# Patient Record
Sex: Male | Born: 1960 | Race: White | Hispanic: No | Marital: Single | Smoking: Former smoker
Health system: Southern US, Community
[De-identification: ages and names within clinical notes are randomized; demographics above are authoritative.]

## PROBLEM LIST (undated history)

## (undated) DIAGNOSIS — F1410 Cocaine abuse, uncomplicated: Secondary | ICD-10-CM

## (undated) DIAGNOSIS — L409 Psoriasis, unspecified: Secondary | ICD-10-CM

## (undated) DIAGNOSIS — I10 Essential (primary) hypertension: Secondary | ICD-10-CM

## (undated) DIAGNOSIS — E785 Hyperlipidemia, unspecified: Secondary | ICD-10-CM

## (undated) DIAGNOSIS — I2510 Atherosclerotic heart disease of native coronary artery without angina pectoris: Secondary | ICD-10-CM

## (undated) HISTORY — DX: Psoriasis, unspecified: L40.9

## (undated) HISTORY — DX: Hyperlipidemia, unspecified: E78.5

## (undated) HISTORY — DX: Cocaine abuse, uncomplicated: F14.10

## (undated) HISTORY — PX: OTHER SURGICAL HISTORY: SHX169

## (undated) HISTORY — DX: Atherosclerotic heart disease of native coronary artery without angina pectoris: I25.10

## (undated) HISTORY — DX: Essential (primary) hypertension: I10

---

## 2008-07-17 ENCOUNTER — Ambulatory Visit: Payer: Self-pay | Admitting: Cardiology

## 2008-07-17 ENCOUNTER — Encounter: Payer: Self-pay | Admitting: Cardiology

## 2008-07-18 ENCOUNTER — Encounter: Payer: Self-pay | Admitting: Cardiology

## 2008-07-18 ENCOUNTER — Inpatient Hospital Stay (HOSPITAL_COMMUNITY): Admission: AD | Admit: 2008-07-18 | Discharge: 2008-07-21 | Payer: Self-pay | Admitting: Cardiovascular Disease

## 2008-07-18 ENCOUNTER — Ambulatory Visit: Payer: Self-pay | Admitting: Cardiology

## 2008-07-19 ENCOUNTER — Encounter: Payer: Self-pay | Admitting: Cardiology

## 2008-07-20 ENCOUNTER — Encounter: Payer: Self-pay | Admitting: Cardiology

## 2008-07-21 ENCOUNTER — Encounter: Payer: Self-pay | Admitting: Cardiology

## 2008-08-04 ENCOUNTER — Ambulatory Visit: Payer: Self-pay | Admitting: Cardiology

## 2008-09-02 ENCOUNTER — Encounter: Payer: Self-pay | Admitting: Physician Assistant

## 2008-10-17 DIAGNOSIS — I10 Essential (primary) hypertension: Secondary | ICD-10-CM | POA: Insufficient documentation

## 2008-10-17 DIAGNOSIS — E785 Hyperlipidemia, unspecified: Secondary | ICD-10-CM | POA: Insufficient documentation

## 2008-10-17 DIAGNOSIS — I2510 Atherosclerotic heart disease of native coronary artery without angina pectoris: Secondary | ICD-10-CM | POA: Insufficient documentation

## 2008-11-06 ENCOUNTER — Telehealth: Payer: Self-pay | Admitting: Physician Assistant

## 2008-11-06 ENCOUNTER — Encounter: Payer: Self-pay | Admitting: Cardiology

## 2009-01-01 ENCOUNTER — Emergency Department (HOSPITAL_COMMUNITY): Admission: EM | Admit: 2009-01-01 | Discharge: 2009-01-01 | Payer: Self-pay | Admitting: Emergency Medicine

## 2009-01-01 ENCOUNTER — Encounter: Payer: Self-pay | Admitting: Cardiology

## 2009-02-23 ENCOUNTER — Ambulatory Visit: Payer: Self-pay | Admitting: Cardiology

## 2009-02-23 DIAGNOSIS — F1910 Other psychoactive substance abuse, uncomplicated: Secondary | ICD-10-CM

## 2009-03-06 ENCOUNTER — Encounter (INDEPENDENT_AMBULATORY_CARE_PROVIDER_SITE_OTHER): Payer: Self-pay | Admitting: *Deleted

## 2009-03-20 ENCOUNTER — Encounter (INDEPENDENT_AMBULATORY_CARE_PROVIDER_SITE_OTHER): Payer: Self-pay | Admitting: *Deleted

## 2009-03-27 ENCOUNTER — Encounter: Payer: Self-pay | Admitting: Cardiology

## 2009-12-15 ENCOUNTER — Ambulatory Visit: Payer: Self-pay | Admitting: Cardiology

## 2010-02-17 ENCOUNTER — Telehealth (INDEPENDENT_AMBULATORY_CARE_PROVIDER_SITE_OTHER): Payer: Self-pay | Admitting: *Deleted

## 2010-02-22 ENCOUNTER — Encounter: Payer: Self-pay | Admitting: Cardiology

## 2010-02-23 NOTE — Letter (Signed)
Summary: Generic Engineer, agricultural at [facility] [address]   [place], [place] [address]   Phone: [phone]  Fax: [phone]        March 06, 2009 MRN: [id number]    DOUG Hargrove [address] [place], [place]  [address]    Dear Mr. Knupp,  We ordered labwork to check you cholesterol and liver function labs at your January 31st office visit. It does not appear this labwork has been done yet.   Please take the enclosed order to the [facility] at your earliest convenience to have this labwork done. Do not eat or drink after midnight.        Sincerely,  Cyril Loosen, RN, BSN  This letter has been electronically signed by your physician.  Appended Document: Generic Letter Pt's sister called stating pt has not had labs done yet b/c she got the flu and couldn't take him. She states now he has the flu. She states she will have labs done as soon as he's feeling better.

## 2010-02-23 NOTE — Letter (Signed)
Summary: Certified Letter Response Card  Certified Letter Response Card   Imported By: Cyril Loosen, RN, BSN 03/30/2009 10:10:30  _____________________________________________________________________  External Attachment:    Type:   Image     Comment:   External Document

## 2010-02-23 NOTE — Assessment & Plan Note (Signed)
Summary: 6 MO FU PER AUG REMINDER-SRS   Visit Type:  Follow-up Primary Britiany Silbernagel:  Dr.Golding   History of Present Illness: patient has history of cocaine use as well as mild coronary artery disease, and a small diagonal that was subtotally occluded. 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 The patient had a lipid panel done as well as LFTs are largest year. LDL was 99 HDL 31 cholesterol 159/143 liver function tests were within normal limits. Eating too much,  gaining weight. No cocaine. No cigarretes.   No scp only around smokers.  Severe psoriasis, bleeding all the time  Preventive Screening-Counseling & Management  Alcohol-Tobacco     Smoking Status: quit     Year Quit: 06/2008  Current Medications (verified): 1)  Simvastatin 40 Mg Tabs (Simvastatin) .... Take 1 Tab By Mouth At Bedtime 2)  Lisinopril 10 Mg Tabs (Lisinopril) .... Take 1 Tablet By Mouth Once A Day 3)  Allopurinol 100 Mg Tabs (Allopurinol) .... Take 1 Tablet By Mouth Once A Day 4)  Isosorbide Mononitrate Cr 60 Mg Xr24h-Tab (Isosorbide Mononitrate) .... Take 1 Tablet By Mouth Once A Day (Place On File) 5)  Carvedilol 6.25 Mg Tabs (Carvedilol) .... Take 1 Tablet By Mouth Two Times A Day 6)  Aspirin 325 Mg Tabs (Aspirin) .... Take 1 Tablet By Mouth Once A Day 7)  Nitrostat 0.4 Mg Subl (Nitroglycerin) .Marland Kitchen.. 1 Tablet Under Tongue At Onset of Chest Pain; You May Repeat Every 5 Minutes For Up To 3 Doses. 8)  Taclonex 0.005-0.064 % Oint (Calcipotriene-Betameth Diprop) .... Apply To Affected Area Daily, Do Not Apply To Face, Axillae, or Groin  Allergies (verified): No Known Drug Allergies  Past History:  Past Medical History: Last updated: 10/17/2008 HYPERTENSION, UNSPECIFIED (ICD-401.9) HYPERLIPIDEMIA-MIXED (ICD-272.4) CAD, NATIVE VESSEL (ICD-414.01) Psoriasis.  Probable diabetes mellitus  Gout  Family History: Last updated: 10/17/2008 Family History of Cancer:    Social History: Last updated: 10/17/2008 Single  Tobacco Use - Yes.  Alcohol Use - yes Drug Use - yes - cocaine  Risk Factors: Smoking Status: quit (12/15/2009)  Vital Signs:  Patient profile:   50 year old male Height:      68 inches Weight:      225 pounds BMI:     34.33 Pulse rate:   86 / minute BP sitting:   121 / 84  (left arm) Cuff size:   large  Vitals Entered By: Carlye Grippe (December 15, 2009 9:18 AM)  Nutrition Counseling: Patient's BMI is greater than 25 and therefore counseled on weight management options.  Physical Exam  Additional Exam:  General: Well-developed, well-nourished in no distress head: Normocephalic and atraumatic eyes PERRLA/EOMI intact, conjunctiva and lids normal nose: No deformity or lesions mouth normal dentition, normal posterior pharynx neck: Supple, no JVD.  No masses, thyromegaly or abnormal cervical nodes. Normal carotid upstroke and no carotid bruits. lungs: Normal breath sounds bilaterally without wheezing.  Normal percussion heart: regular rate and rhythm with normal S1 and S2, no S3 or S4.  PMI is normal.  No pathological murmurs abdomen: Normal bowel sounds, abdomen is soft and nontender without masses, organomegaly or hernias noted.  No hepatosplenomegaly musculoskeletal: Back normal, normal gait muscle strength and tone normal pulsus: Pulse is normal in all 4 extremities Extremities: No peripheral pitting edema. No definite evidence of gouty arthropathy. neurologic: Alert and oriented x 3 skin: notable for significant lesions on both arms and extensor surfaces of the lower extremities consistent with  psoriasis. Some areas have mild oozing. The lesions appear to be dry. cervical nodes: No significant adenopathy psychologic: Normal affect    Impression & Recommendations:  Problem # 1:  CAD, NATIVE VESSEL (ICD-414.01) the patient has mild coronary artery disease.  He had a small diagonal vessel that was subtotally  occluded.  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  He denies any chest pain. His updated medication list for this problem includes:    Lisinopril 10 Mg Tabs (Lisinopril) .Marland Kitchen... Take 1 tablet by mouth once a day    Isosorbide Mononitrate Cr 60 Mg Xr24h-tab (Isosorbide mononitrate) .Marland Kitchen... Take 1 tablet by mouth once a day (place on file)    Carvedilol 6.25 Mg Tabs (Carvedilol) .Marland Kitchen... Take 1 tablet by mouth two times a day    Aspirin 325 Mg Tabs (Aspirin) .Marland Kitchen... Take 1 tablet by mouth once a day    Nitrostat 0.4 Mg Subl (Nitroglycerin) .Marland Kitchen... 1 tablet under tongue at onset of chest pain; you may repeat every 5 minutes for up to 3 doses.  Problem # 2:  SUBSTANCE ABUSE (ICD-305.90) patient has history of cocaine use.  Apparently has stopped using this  Problem # 3:  HYPERTENSION, UNSPECIFIED (ICD-401.9) stable His updated medication list for this problem includes:    Lisinopril 10 Mg Tabs (Lisinopril) .Marland Kitchen... Take 1 tablet by mouth once a day    Carvedilol 6.25 Mg Tabs (Carvedilol) .Marland Kitchen... Take 1 tablet by mouth two times a day    Aspirin 325 Mg Tabs (Aspirin) .Marland Kitchen... Take 1 tablet by mouth once a day  Problem # 4:  HYPERLIPIDEMIA-MIXED (ICD-272.4) we discussed his lipid panel.  LDL 99 HDL 31. His updated medication list for this problem includes:    Simvastatin 40 Mg Tabs (Simvastatin) .Marland Kitchen... Take 1 tab by mouth at bedtime  Patient Instructions: 1)  Taclonex - apply to affected area daily 2)  Follow up in  6 months Prescriptions: TACLONEX 0.005-0.064 % OINT (CALCIPOTRIENE-BETAMETH DIPROP) apply to affected area daily, do not apply to face, axillae, or groin  #1 x 2   Entered by:   Hoover Brunette, LPN   Authorized by:   Lewayne Bunting, MD, [facility]   Signed by:   Hoover Brunette, LPN on 16/10/9602   Method used:   Electronically to        [facility] (retail)       [address]       [place], [place]  [address]       Ph: [phone]       Fax: [phone]   RxID:    [id number]   Handout requested.

## 2010-02-23 NOTE — Consult Note (Signed)
Summary: CARDIOLOGY CONSULT/MH  CARDIOLOGY CONSULT/MH   Imported By: Zachary George 02/23/2009 11:24:04  _____________________________________________________________________  External Attachment:    Type:   Image     Comment:   External Document

## 2010-02-23 NOTE — Assessment & Plan Note (Signed)
Summary: 6 MO FU REMINDER-SRS   Visit Type:  Follow-up Primary Provider:  Dr.Golding  CC:  follow-up visit.  History of Present Illness: the patient is a 50 year old male with a history of substernal chest pain and coronary artery disease. The patient status post cardiac catheterization July 18, 2008. His cocaine screen is positive at that time. Was found to have a small diagonal was totally occluded. 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 There was also 5060% stenosis in right coronary artery. The patient was treated medically. Patient also reports tobacco use. He denies any current substance abuse. The patient reports that he is compliant with his medical regimen. He has occasional exertional chest pain. He did not receive a stent and therefore Plavix can be discontinued. He denies any rectal pain PND palpitations or syncope. 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 He reports insomnia and uses occasional Xanax.  Clinical Review Panels:  CXR CXR results The cardiopericardial silhouette is enlarged. There is         bibasilar atelectasis without focal airspace consolidation or         pulmonary edema.  No evidence for pleural effusion. Imaged bony         structures of the thorax are intact. Telemetry leads overlie the         chest.                   IMPRESSION:         Cardiomegaly with bibasilar atelectasis. (07/17/2008)  Cardiac Imaging Cardiac Cath Findings  CONCLUSIONS:   1. Preserved overall left ventricular function.   2. Total occlusion of the diagonal, which likely represents the acute       infarct vessel.   3. Total occlusion of a circumflex subbranch with retrograde       collateralization.   4. A 60-70% mid right coronary artery stenosis.   5. Moderately high-grade stenosis of the ramus intermedius.      PLAN:  The patient has had recent cocaine  exposure.  At the present   time, my initial plan would be to treat him medically.  He will be put   on aspirin, Plavix, beta-blockade, as well as low-dose nitrates.  We   will let the cocaine get out of his system.  He will be treated with the   medication.  We will try to get him to stop smoking.  I may well   consider bringing him back for repeat angiography to see if the lesions   of the mid right and circumflex have improved.  We may consider him as a   candidate for percutaneous intervention, but I would be somewhat   reluctant to do, as I am not sure about his medical compliance with a   dual antiplatelet regimen, and also I am not convinced that he would be   a good candidate for drug-eluting stent implantation.  The RCA would   likely involve a long territory of treatment.  We will continue to   review these options with the patient.      Arturo Morton. Riley Kill, MD, [facility]  (07/18/2008)    Preventive Screening-Counseling & Management  Alcohol-Tobacco     Smoking Status: quit     Year Quit: 06/2008  Current Medications (verified): 1)  Simvastatin 40 Mg Tabs (Simvastatin) .... Take 1 Tab By Mouth At Bedtime 2)  Lisinopril  10 Mg Tabs (Lisinopril) .... Take 1 Tablet By Mouth Once A Day 3)  Allopurinol 100 Mg Tabs (Allopurinol) .... Take 1 Tablet By Mouth Once A Day 4)  Isosorbide Mononitrate Cr 60 Mg Xr24h-Tab (Isosorbide Mononitrate) .... Take 1 Tablet By Mouth Once A Day (Place On File) 5)  Carvedilol 6.25 Mg Tabs (Carvedilol) .... Take 1 Tablet By Mouth Two Times A Day 6)  Aspirin 325 Mg Tabs (Aspirin) .... Take 1 Tablet By Mouth Once A Day 7)  Nitrostat 0.4 Mg Subl (Nitroglycerin) .Marland Kitchen.. 1 Tablet Under Tongue At Onset of Chest Pain; You May Repeat Every 5 Minutes For Up To 3 Doses.  Allergies (verified): No Known Drug Allergies  Comments:  Nurse/Medical Assistant: The patient is currently on medications but does not know the name or dosage at this time. Instructed to contact  our office with details. Will update medication list at that time.  Past History:  Past Medical History: Last updated: 10/17/2008 HYPERTENSION, UNSPECIFIED (ICD-401.9) HYPERLIPIDEMIA-MIXED (ICD-272.4) CAD, NATIVE VESSEL (ICD-414.01) Psoriasis.  Probable diabetes mellitus  Gout  Family History: Last updated: 10/17/2008 Family History of Cancer:   Social History: Last updated: 10/17/2008 Single  Tobacco Use - Yes.  Alcohol Use - yes Drug Use - yes - cocaine  Risk Factors: Smoking Status: quit (02/23/2009)  Social History: Smoking Status:  quit  Review of Systems       The patient complains of chest pain, joint pain, and rash.  The patient denies fatigue, malaise, fever, weight gain/loss, vision loss, decreased hearing, hoarseness, palpitations, shortness of breath, prolonged cough, wheezing, sleep apnea, coughing up blood, abdominal pain, blood in stool, nausea, vomiting, diarrhea, heartburn, incontinence, blood in urine, muscle weakness, leg swelling, skin lesions, headache, fainting, dizziness, depression, anxiety, enlarged lymph nodes, easy bruising or bleeding, and environmental allergies.    Vital Signs:  Patient profile:   50 year old male Height:      68 inches Weight:      199 pounds BMI:     30.37 Pulse rate:   85 / minute BP sitting:   109 / 75  (left arm) Cuff size:   large  Vitals Entered By: Carlye Grippe (February 23, 2009 2:16 PM)  Nutrition Counseling: Patient's BMI is greater than 25 and therefore counseled on weight management options. CC: follow-up visit   Physical Exam  Additional Exam:  General: Well-developed, well-nourished in no distress head: Normocephalic and atraumatic eyes PERRLA/EOMI intact, conjunctiva and lids normal nose: No deformity or lesions mouth normal dentition, normal posterior pharynx neck: Supple, no JVD.  No masses, thyromegaly or abnormal cervical nodes. Normal carotid upstroke and no carotid bruits. lungs: Normal  breath sounds bilaterally without wheezing.  Normal percussion heart: regular rate and rhythm with normal S1 and S2, no S3 or S4.  PMI is normal.  No pathological murmurs abdomen: Normal bowel sounds, abdomen is soft and nontender without masses, organomegaly or hernias noted.  No hepatosplenomegaly musculoskeletal: Back normal, normal gait muscle strength and tone normal pulsus: Pulse is normal in all 4 extremities Extremities: No peripheral pitting edema. No definite evidence of gouty arthropathy. neurologic: Alert and oriented x 3 skin: notable for significant lesions on both arms and extensor surfaces of the lower extremities consistent with psoriasis. Some areas have mild oozing. The lesions appear to be dry. cervical nodes: No significant adenopathy psychologic: Normal affect    Impression & Recommendations:  Problem # 1:  CAD, NATIVE VESSEL (ICD-414.01) the patient reports occasional exertional chest pain. He does  have significant coronary artery disease, a bite no indication for intervention. Status post non-ST elevation myocardial infarction June of 2010. At this point I feel Plavix can be discontinued we will increase Imdur to 60 mg p.o. q. daily and the patient will be provided with sublingual nitroglycerin p.r.n. The following medications were removed from the medication list:    Plavix 75 Mg Tabs (Clopidogrel bisulfate) .Marland Kitchen... Take 1 tablet by mouth once a day His updated medication list for this problem includes:    Lisinopril 10 Mg Tabs (Lisinopril) .Marland Kitchen... Take 1 tablet by mouth once a day    Isosorbide Mononitrate Cr 60 Mg Xr24h-tab (Isosorbide mononitrate) .Marland Kitchen... Take 1 tablet by mouth once a day (place on file)    Carvedilol 6.25 Mg Tabs (Carvedilol) .Marland Kitchen... Take 1 tablet by mouth two times a day    Aspirin 325 Mg Tabs (Aspirin) .Marland Kitchen... Take 1 tablet by mouth once a day    Nitrostat 0.4 Mg Subl (Nitroglycerin) .Marland Kitchen... 1 tablet under tongue at onset of chest pain; you may repeat every  5 minutes for up to 3 doses.  Orders: T-Lipid Profile [id number]) T-Hepatic Function [phone])  Problem # 2:  HYPERLIPIDEMIA-MIXED (ICD-272.4) the patient had no recent evaluation of his lipids and LFTs. A lipid panel and LFTs have been ordered. Further adjustments in his statin drug therapy will depend on his blood work. His updated medication list for this problem includes:    Simvastatin 40 Mg Tabs (Simvastatin) .Marland Kitchen... Take 1 tab by mouth at bedtime  Orders: T-Lipid Profile ([id number]) T-Hepatic Function [id number])  Problem # 3:  HYPERTENSION, UNSPECIFIED (ICD-401.9) blood pressure is well-controlled. No further adjustments in medications appear to be needed. His updated medication list for this problem includes:    Lisinopril 10 Mg Tabs (Lisinopril) .Marland Kitchen... Take 1 tablet by mouth once a day    Carvedilol 6.25 Mg Tabs (Carvedilol) .Marland Kitchen... Take 1 tablet by mouth two times a day    Aspirin 325 Mg Tabs (Aspirin) .Marland Kitchen... Take 1 tablet by mouth once a day  Orders: T-Lipid Profile ([id number]) T-Hepatic Function ([id number])  Problem # 4:  SUBSTANCE ABUSE (ICD-305.90) the patient has been counseled regarding prior substance abuse as well as tobacco use. The patient denies any current use of either.  Patient Instructions: 1)  Increase Imdur to 60mg  daily 2)  Sublingual Nitroglycerin 0.4mg  as needed for severe chest pain 3)  Stop Plavix  4)  Labs:  fasting when convenient.   5)  Follow up in  6 months. Prescriptions: NITROSTAT 0.4 MG SUBL (NITROGLYCERIN) 1 tablet under tongue at onset of chest pain; you may repeat every 5 minutes for up to 3 doses.  #25 x 3   Entered by:   Hoover Brunette, LPN   Authorized by:   Lewayne Bunting, MD, [facility]   Signed by:   Hoover Brunette, LPN on 03/47/4259   Method used:   Electronically to        [facility] (retail)       [address]       [place], [place]  [address]       Ph: [phone]       Fax: [phone]   RxID:    [id number] ISOSORBIDE MONONITRATE CR 60 MG XR24H-TAB (ISOSORBIDE MONONITRATE) Take 1 tablet by mouth once a day (PLACE ON FILE)  #30 x 6   Entered by:   Hoover Brunette, LPN   Authorized by:  Lewayne Bunting, MD, [facility]   Signed by:   Hoover Brunette, LPN on 16/10/9602   Method used:   Electronically to        [facility] (retail)       [address]       [place], [place]  [address]       Ph: [phone]       Fax: [phone]   RxID:   [id number]

## 2010-02-23 NOTE — Letter (Signed)
Summary: Generic Engineer, agricultural at [facility] [address]   [place], [place] [address]   Phone: [phone]  Fax: [phone]        March 20, 2009 MRN: [id number]    [name] [address] [place], [place]  [address]    Dear [name],  We order lab work to follow up on your cholesterol and liver function levels. You were asked to have this labwork done following your January 31st office visit. We also mailed you a letter on February 11th to remind you to have this done. However, it does not appear this lab work has been done yet.  Please take the enclosed order to the [facility] at your earliest convenience to have this done. Do not eat or drink after midnight.  If you primary MD has done this lab work for you or you do not plan to have it done, please contact our office so that we can properly document this in your chart.       Sincerely,  Cyril Loosen, RN, BSN  This letter has been electronically signed by your physician.

## 2010-02-24 ENCOUNTER — Telehealth (INDEPENDENT_AMBULATORY_CARE_PROVIDER_SITE_OTHER): Payer: Self-pay | Admitting: *Deleted

## 2010-03-01 ENCOUNTER — Encounter: Payer: Self-pay | Admitting: Cardiology

## 2010-03-03 NOTE — Progress Notes (Signed)
Summary: REQUEST REFILL ALLOPURINOL  Phone Note Call from Patient Call back at Home Phone [phone]   Caller: Patient Call For: doctor Summary of Call: patient want refill allopurinol 100mg  sent to [facility]. Patient's sister said that patient has always gotten this medication from cardiologist and can't afford to see PCP and never prescribed by PCP.  Initial call taken by: Carlye Grippe,  February 17, 2010 5:19 PM  Follow-up for Phone Call        sister called back today inquiring about status of refill on allopurinol. sister fustrated that rx not already refilled. Nurse advised sister that once MD addresses issue, nurse will respond. Follow-up by: Carlye Grippe,  February 19, 2010 4:14 PM  Additional Follow-up for Phone Call Additional follow up Details #1::        sister informed that MD response is to get from PCP. Additional Follow-up by: Carlye Grippe,  February 22, 2010 2:01 PM

## 2010-03-03 NOTE — Medication Information (Signed)
Summary: [facility]  RX Folder/ [facility]   Imported By: Dorise Hiss 02/23/2010 15:17:27  _____________________________________________________________________  External Attachment:    Type:   Image     Comment:   External Document

## 2010-03-03 NOTE — Progress Notes (Signed)
Summary: REQUEST ENOUGH ALLOPURINOL TO LAST TILL PCP APPT  Phone Note Call from Patient Call back at Home Phone [phone]   Caller: sister Call For: nurse Summary of Call: patient sister left message on nurse voicemail that [facility] and want rx for allopurinol #6 tablets to last until that time. sister said that patient couldn't even walk without this and she thought it was ashame that a doctor would do this and should have informed them at the last appt that the medication couldn't be refilled any longer. Initial call taken by: Carlye Grippe,  February 24, 2010 4:10 PM  Follow-up for Phone Call        he can have allopurinol refilled. However if patient is in acute pain from gouty arthritis, then allopurinol as not indicated that brought her to patient needs colchicine. I suggest you seen as soon as possible for evaluation for either the health Department or his primary care physician. Again it's okay to refill allopurinol but this will not help with acute pain. This is a chronic maintenance and preventive therapy. Follow-up by: Lewayne Bunting, MD, [facility],  February 25, 2010 7:41 AM  Additional Follow-up for Phone Call Additional follow up Details #1::        left message on machine to call office.   Additional Follow-up by: Carlye Grippe,  February 25, 2010 8:36 AM    Additional Follow-up for Phone Call Additional follow up Details #2::    Patient informed of the above.  Follow-up by: Carlye Grippe,  February 25, 2010 2:01 PM  Prescriptions: ALLOPURINOL 100 MG TABS (ALLOPURINOL) Take 1 tablet by mouth once a day  #30 x 0   Entered by:   Carlye Grippe   Authorized by:   Lewayne Bunting, MD, [facility]   Signed by:   Carlye Grippe on 02/25/2010   Method used:   Electronically to        [facility] (retail)       [address]       [place], [place]  [address]       Ph: [phone]       Fax: [phone]   RxID:    [id number]

## 2010-03-11 NOTE — Letter (Signed)
Summary: External Correspondence/ [facility] [employer] [facility]  External Correspondence/ [facility] [facility]   Imported By: Dorise Hiss 03/03/2010 16:48:09  _____________________________________________________________________  External Attachment:    Type:   Image     Comment:   External Document

## 2010-04-27 LAB — DIFFERENTIAL
Eosinophils Absolute: 0.1 10*3/uL (ref 0.0–0.7)
Lymphs Abs: 1.8 10*3/uL (ref 0.7–4.0)
Monocytes Absolute: 0.5 10*3/uL (ref 0.1–1.0)
Monocytes Relative: 9 % (ref 3–12)
Neutrophils Relative %: 59 % (ref 43–77)

## 2010-04-27 LAB — POCT CARDIAC MARKERS
CKMB, poc: 1.5 ng/mL (ref 1.0–8.0)
Troponin i, poc: <0.05 ng/mL (ref 0.00–0.09)

## 2010-04-27 LAB — CBC
HCT: 35.2 % — ABNORMAL LOW (ref 39.0–52.0)
Hemoglobin: 11.9 g/dL — ABNORMAL LOW (ref 13.0–17.0)
MCHC: 33.7 g/dL (ref 30.0–36.0)
MCV: 83.3 fL (ref 78.0–100.0)
RBC: 4.22 MIL/uL (ref 4.22–5.81)
WBC: 5.9 10*3/uL (ref 4.0–10.5)

## 2010-04-27 LAB — BASIC METABOLIC PANEL
CO2: 24 mEq/L (ref 19–32)
Chloride: 103 mEq/L (ref 96–112)
Glucose, Bld: 116 mg/dL — ABNORMAL HIGH (ref 70–99)
Potassium: 4.5 mEq/L (ref 3.5–5.1)
Sodium: 133 mEq/L — ABNORMAL LOW (ref 135–145)

## 2010-04-27 LAB — RAPID URINE DRUG SCREEN, HOSP PERFORMED
Benzodiazepines: NOT DETECTED
Cocaine: NOT DETECTED
Opiates: NOT DETECTED
Tetrahydrocannabinol: NOT DETECTED

## 2010-05-03 LAB — GLUCOSE, CAPILLARY
Glucose-Capillary: 110 mg/dL — ABNORMAL HIGH (ref 70–99)
Glucose-Capillary: 110 mg/dL — ABNORMAL HIGH (ref 70–99)
Glucose-Capillary: 116 mg/dL — ABNORMAL HIGH (ref 70–99)
Glucose-Capillary: 130 mg/dL — ABNORMAL HIGH (ref 70–99)
Glucose-Capillary: 155 mg/dL — ABNORMAL HIGH (ref 70–99)

## 2010-05-03 LAB — CBC
Platelets: 312 10*3/uL (ref 150–400)
RDW: 15.4 % (ref 11.5–15.5)

## 2010-05-03 LAB — BASIC METABOLIC PANEL
BUN: 9 mg/dL (ref 6–23)
Creatinine, Ser: 0.99 mg/dL (ref 0.4–1.5)
GFR calc non Af Amer: >60 mL/min (ref >60)

## 2010-05-03 LAB — CK TOTAL AND CKMB (NOT AT ARMC)
CK, MB: 13.4 ng/mL — ABNORMAL HIGH (ref 0.3–4.0)
Relative Index: 12.2 — ABNORMAL HIGH (ref 0.0–2.5)
Relative Index: 14.6 — ABNORMAL HIGH (ref 0.0–2.5)
Total CK: 110 U/L (ref 7–232)
Total CK: 169 U/L (ref 7–232)

## 2010-05-03 LAB — LIPID PANEL
Total CHOL/HDL Ratio: 8.6 RATIO
Triglycerides: 434 mg/dL — ABNORMAL HIGH (ref <150)
VLDL: UNDETERMINED mg/dL (ref 0–40)

## 2010-06-08 NOTE — Discharge Summary (Signed)
[name], [name] NO.:  [id number]   MEDICAL RECORD NO.:  [id number]          PATIENT TYPE:  INP   LOCATION:  3711                         FACILITY:  [facility]   PHYSICIAN:  Steven Abed, MD, FACCDATE OF BIRTH:  1960/03/11   DATE OF ADMISSION:  07/18/2008  DATE OF DISCHARGE:  07/21/2008                               DISCHARGE SUMMARY   PRIMARY CARDIOLOGIST:  Steven Frieze. Jens Som, MD, [facility], in [place]   PRIMARY CARE PHYSICIAN:  Not listed.  Physician in [place] is listed as  Loney Laurence, MD   PROCEDURES PERFORMED DURING HOSPITALIZATION:  1. Cardiac catheterization completed by Dr. Shawnie Pons on July 18, 2008, revealing preserved overall left ventricular function and      total occlusion of the diagonal, which likely represents the acute      infarct vessel.  2. Total occlusion of left circumflex sub-branch with retrograde      collateralization.  3. Mid right coronary artery stenosis 60-70%.  4. Moderately high-grade stenosis of the ramus intermedius.      a.     The patient has had recent cocaine exposure.  We will       treated with medications and consider bring him back for repeat       angiography to see if lesions of the mid right and circumflex have       improved after cocaine is out of his system.  He may be considered       for PCI, but would be some were reluctant to do as I am not sure       of medical compliance.  The right coronary artery would likely       involve a long territory of treatment.  We will continue to review       options with the patient.   FINAL DISCHARGE DIAGNOSES:  1. Non-ST elevated myocardial infarction.  2. Status post cardiac catheterization with 3-vessel disease with need      for percutaneous coronary intervention.  Once the patient      discontinues cocaine, question need to proceed with this with the      patient's history of medical noncompliance.  3. Polysubstance abuse.  4. Hypertension.  5. Elevated  hemoglobin A1c.  6. Psoriasis.   HOSPITAL COURSE:  This is a 50 year old male patient who originally has  presented to [facility] in [place] with complaints of chest pressure  and pain.  The patient was admitted to rule out myocardial infarction  and was found to have positive cardiac enzymes.  Troponin is 0.06-14.25  with an MB of 69.4.  The patient was positive for cocaine along with  history of tobacco abuse and alcohol abuse.  The patient was transferred  to [facility] in the setting of non-ST elevated MI and did  undergo cardiac catheterization as described above.   Cardiac catheterization was completed by Dr. Shawnie Pons, but no  intervention was completed at this time.  The patient did have cocaine  in his system and it was felt that the patient  should be treated  medically for now and be considered as a candidate for PCI of the right  coronary artery.  It would involve a long territory of treatment and it  is questionable whether the patient will continue to be compliant and  therefore we would not proceed at this time until cocaine abuse has  discontinued.  The patient was placed on Plavix, aspirin, beta-blocker,  nitrates, and ACE inhibitor and is to be followed up in [facility]  prior post discharge.   The patient was seen and examined by Dr. Willa Rough on day of  discharge and found to be stable.  If the patient did not seem to  understand or have insight a need to stop cocaine abuse along with  tobacco and alcohol, it was reiterated several times during  hospitalization at [facility] and at [place].  It is our hope that the  patient will discontinue use of these medications.  He will be seen in  the office in [facility] by Dr. Jens Hayes on followup.   DISCHARGE LABORATORIES:  Cholesterol 293, lipids 434, HDL 34, and LDL  could not be calculated.  Final troponin on discharge was not completed.  His CK was 110 and CK-MB was 13.4.  Sodium 137, potassium 4.0,  chloride  107, CO2 94, glucose 183, BUN 9, and creatinine 0.99.  Hemoglobin 12.4,  hematocrit 36.3, white blood cells 10.2, and platelets 312.   DISCHARGE VITAL SIGNS:  Blood pressure 140/94, pulse 67, and temperature  98.7.   DISCHARGE MEDICATIONS:  1. Ramipril 5 mg daily.  2. Aspirin 325 daily.  3. Plavix 75 mg daily.  4. Allopurinol 100 mg daily.  5. Simvastatin 40 mg at bedtime.  6. Carvedilol 6.25 twice a day.  7. Isosorbide mononitrate 30 mg daily.   ALLERGIES:  No known drug allergies.   FOLLOWUP PLANS AND APPOINTMENT:  1. The patient will follow up with Dr. Olga Millers in the [place]      office.  Appointment will be made as the office is closed at this      time.  2. The patient has been advised to have a primary care physician for      continued medical management and follow up with issues of diabetes.  3. The patient will need to have lipids and LFTs in 6 weeks for      evaluation of liver in the setting of new prescription for statin      therapy.  4. The patient has been advised repeatedly to stop cocaine and alcohol      and tobacco use.  5. The patient has been given post cardiac catheterization      instructions with particular emphasis on the right groin site for      evidence of bleeding hematoma and signs of infection.   Time spent with the patient to include physician time 35 minutes.      Bettey Mare. Lyman Bishop, NP      Steven Abed, MD, [facility]  Electronically Signed    KML/MEDQ  D:  07/21/2008  T:  07/21/2008  Job:  [id number]

## 2010-06-08 NOTE — Cardiovascular Report (Signed)
[name] NO.:  [id number]   MEDICAL RECORD NO.:  [id number]          PATIENT TYPE:  INP   LOCATION:  2501                         FACILITY:  [facility]   PHYSICIAN:  Arturo Morton. Riley Kill, MD, FACCDATE OF BIRTH:  11-23-1960   DATE OF PROCEDURE:  07/18/2008  DATE OF DISCHARGE:                            CARDIAC CATHETERIZATION   INDICATIONS:  [name] is a 50 year old gentleman who presents with  a non-ST-elevation MI.  The patient smokes 3 packs per day by playing  video poker and also is positive for cocaine and admits to using cocaine  in the past 4 days.  He presented with chest pain and positive enzymes  and was referred for diagnostic cardiac catheterization.  Risks,  benefits, and alternatives were discussed with the patient and he agreed  to proceed.   PROCEDURES:  1. Left heart catheterization.  2. Selective coronary arteriography.  3. Selective left ventriculography.   DESCRIPTION OF PROCEDURE:  The procedure was performed via the right  coronary artery using 6-French catheters.  He tolerated the procedure  well, and there were no complications.  Following this, central aortic  and left ventricular pressures measured with pigtail.  Ventriculography  was performed in the RAO projection.  I then called Dr. Marca Ancona to  the Catheterization Laboratory to review the films with me.  The patient  has 2 total occlusions, the diagonal most likely being __________of the  myocardial infarction.  He has 2 other lesions due to his recent cocaine  use.  The fact that these do not appear to be critical we elected to  treat him medically and start initially with medical regimen.  The groin  was then re-prepped carefully.  Following re-prep of the groin, the  artery was sealed using an Angio-Seal closure device.  There were no  major complications.  He was taken to the holding area in satisfactory  condition.  I reviewed the findings with his family.   HEMODYNAMIC DATA:  1. The central aortic pressure is 139/96, mean of 17.  2. Left ventricular pressure 148/12.  3. There was no gradient or pullback across the aortic valve.   ANGIOGRAPHIC DATA:  1. Ventriculography done in the RAO projection revealed vigorous      global systolic function.  Ejection fraction to be estimated 50%.      I did not appreciate a definite wall motion abnormality.  2. The left main coronary artery demonstrates some mild luminal      irregularity throughout.  There is a mid area of plaque      approximately 20%.  In addition, there is a small diagonal branch      that basically appears to be totally occluded and filling in late.      The mid distal portion of the left anterior descending artery has      luminal irregularities but no critical stenoses.  Dr. Shirlee Latch and I      thought that the diagonal could likely represent the acute infarct      vessel.  3. There is a ramus intermedius vessel that bifurcates.  There is a      more medial bifurcation vessel that has probably 75% narrowing.      There is slight decrease density in the LAO caudal view, but in      other views, it appears to be relatively smooth and eccentric.  The      artery distal to that bifurcates and has a fair amount of diffuse      luminal irregularity.  4. 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      There is a common ostium.  The RCA itself has about 30% narrowing      near the proximal portion and then a 40% to 50% area of segmental      narrowing near the proximal mid junction.  Distally, there is a 50-      60% area of fairly smooth vessel.  More distal to this, there is a      posterior descending, posterolateral system with mild luminal      irregularity but no critical stenosis.  5. 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      One branch of the bifurcating vessel  has about 50% narrowing in its      ostium and the other branch is totally occluded and fills by      retrograde collaterals.   CONCLUSIONS:  1. Preserved overall left ventricular function.  2. Total occlusion of the diagonal, which likely represents the acute      infarct vessel.  3. Total occlusion of a circumflex subbranch with retrograde      collateralization.  4. A 60-70% mid right coronary artery stenosis.  5. Moderately high-grade stenosis of the ramus intermedius.   PLAN:  The patient has had recent cocaine exposure.  At the present  time, my initial plan would be to treat him medically.  He will be put  on aspirin, Plavix, beta-blockade, as well as low-dose nitrates.  We  will let the cocaine get out of his system.  He will be treated with the  medication.  We will try to get him to stop smoking.  I may well  consider bringing him back for repeat angiography to see if the lesions  of the mid right and circumflex have improved.  We may consider him as a  candidate for percutaneous intervention, but I would be somewhat  reluctant to do, as I am not sure about his medical compliance with a  dual antiplatelet regimen, and also I am not convinced that he would be  a good candidate for drug-eluting stent implantation.  The RCA would  likely involve a long territory of treatment.  We will continue to  review these options with the patient.       Arturo Morton. Riley Kill, MD, [facility]  Electronically Signed     TDS/MEDQ  D:  07/18/2008  T:  07/19/2008  Job:  [id number]   cc:   CV Laboratory  Madolyn Frieze. Jens Som, MD, [facility]  Jonelle Sidle, MD

## 2010-06-08 NOTE — Assessment & Plan Note (Signed)
[facility]                          [facility] NOTE   [name], [name]                        MRN:          [id number]  DATE:08/04/2008                            DOB:          July 14, 1960    [name] is a 50 year old gentleman, who was recently admitted to  [facility] with chest pain.  He did rule in for myocardial  infarction with serial enzymes.  Also note that his drug screen was  positive for cocaine.  He was transferred to [facility] and  underwent cardiac catheterization.  This was performed on June 25.  He  was found to have an ejection fraction of 50%.  There were no wall  motion abnormalities noted.  There is a 20% left main.  There was a  small diagonal that was totally occluded.  The ramus intermedius  bifurcated and there was a more medial bifurcation vessel that was  probably 75% per Dr. Rosalyn Charters report.  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  There was a 50-  60% right coronary artery.  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  The medical therapy was recommended.  Since discharge, he has  done well.  There is no dyspnea on exertion, orthopnea, PND, pedal  edema, palpitations, syncope, or recurrent chest pain.   His medications include:  1. Allopurinol 100 mg p.o. daily.  2. Imdur 30 mg p.o. daily.  3. Ramipril 5 mg p.o. daily.  4. Simvastatin 40 mg p.o. nightly.  5. Carvedilol 6.25 mg p.o. b.i.d.  6. Aspirin 325 mg p.o. daily.   PHYSICAL EXAMINATION:  VITAL SIGNS:  Shows a blood pressure 128/80, his  pulse is 95.  He weighs 202 pounds.  HEENT:  Normal.  NECK:  Supple and there are no bruits noted.  CHEST:  Clear.  CARDIOVASCULAR:  Regular rate and rhythm.  ABDOMEN:  Shows no tenderness.  His right groin shows no hematoma and no  bruit.  EXTREMITIES:  Show no  edema.   DIAGNOSES:  1. Coronary artery disease, status post myocardial infarction - Mr.      [name] is doing well from a symptomatic standpoint with no chest      pain or increased shortness of breath.  We will continue his      aspirin, beta-blocker, and statin.  I have discontinued his      ramipril after his present prescription expired and instead we will      treat with lisinopril 10 mg p.o. daily for financial reasons.  I      have instructed him on importance of diet, exercise, continuing      avoidance of tobacco and cocaine use.  2. Substance abuse - we discussed the importance of discontinuing      this.  3. Tobacco abuse - he has discontinued this.  4. Hypertension - his blood pressure is controlled.  We will change      his ACE inhibitors as described above.  5. Hyperlipidemia - he will continue on his simvastatin.  At the first      of August, he will need lipids, liver, and a BMET.  6. Psoriasis.  7. Probable diabetes mellitus - the patient is now watching his diet      and he is losing weight.  I have asked him to follow up with a      primary care physician for further monitoring of his serum glucose.      He may need an oral hypoglycemic in the future.  8. Gout.   The patient will see [place] back in 6 months.     Madolyn Frieze Jens Som, MD, [facility]  Electronically Signed    BSC/MedQ  DD: 08/04/2008  DT: 08/05/2008  Job #: [id number]

## 2010-08-02 ENCOUNTER — Ambulatory Visit (INDEPENDENT_AMBULATORY_CARE_PROVIDER_SITE_OTHER): Payer: Self-pay | Admitting: Adult Health

## 2010-08-02 ENCOUNTER — Encounter: Payer: Self-pay | Admitting: Adult Health

## 2010-08-02 DIAGNOSIS — I2510 Atherosclerotic heart disease of native coronary artery without angina pectoris: Secondary | ICD-10-CM

## 2010-08-02 DIAGNOSIS — E785 Hyperlipidemia, unspecified: Secondary | ICD-10-CM

## 2010-08-02 DIAGNOSIS — I10 Essential (primary) hypertension: Secondary | ICD-10-CM

## 2010-08-02 MED ORDER — NITROGLYCERIN 0.4 MG SL SUBL: 0.4000 mg | SUBLINGUAL_TABLET | SUBLINGUAL | Status: DC | PRN

## 2010-08-02 MED ORDER — EZETIMIBE 10 MG PO TABS: 10.0000 mg | ORAL_TABLET | Freq: Every day | ORAL | Status: DC

## 2010-08-02 NOTE — Assessment & Plan Note (Addendum)
I have given him a print out on low cholesterol diet, and started him on Zetia, 10mg  daily in addition to his current statin therapy.  He will need recheck of cholesterol in 3-6 months.  Review of records sent from [facility].  TC: 216  TG: 450; HDL : 35 LDL not calculated. Continue medication changes as above note.

## 2010-08-02 NOTE — Progress Notes (Signed)
HPI: [name] is a 50 y/o male patient of Dr. Andee Lineman we are seeing on annual follow-up with known history of mild nonobstructive CAD and a small diagonal that was subtotally occluded.  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  He is followed by [facility]. Health dept and has had recent cholesterol levels drawn. I have requested the results of this.   He had had no complaint of chest pain, but has occasional night sweats and upper abdominal pain on occasion. He has gained 19 lbs this year, but is actually lower in wt than recorded on last visit with Dr. Andee Lineman. He is compliant with his medications, and is cared for by his sister who helps to manage his medications. He unfortunately does not monitor his blood sugar at home, and therefore does not know how well it is controlled.  He admits to dietary indiscretion and eating a lot of fast food.  No Known Allergies  Current Outpatient Prescriptions  Medication Sig Dispense Refill  . allopurinol (ZYLOPRIM) 100 MG tablet Take 100 mg by mouth daily.        Marland Kitchen aspirin 325 MG tablet Take 325 mg by mouth daily.        . calcipotriene-betamethasone (TACLONEX) ointment Apply topically daily.        . carvedilol (COREG) 6.25 MG tablet Take 6.25 mg by mouth 2 (two) times daily with a meal.        . isosorbide mononitrate (IMDUR) 60 MG 24 hr tablet Take 60 mg by mouth daily.        Marland Kitchen lisinopril (PRINIVIL,ZESTRIL) 10 MG tablet Take 10 mg by mouth daily.        . metFORMIN (GLUMETZA) 500 MG (MOD) 24 hr tablet Take 500 mg by mouth daily with breakfast.        . nitroGLYCERIN (NITROSTAT) 0.4 MG SL tablet Place 1 tablet (0.4 mg total) under the tongue every 5 (five) minutes as needed.  25 tablet  3  . Omega-3 Fatty Acids (FISH OIL) 1000 MG CAPS Take 1 capsule by mouth daily.        . simvastatin (ZOCOR) 40 MG tablet Take 40 mg by mouth at bedtime.        Marland Kitchen DISCONTD: nitroGLYCERIN (NITROSTAT) 0.4 MG SL tablet Place 0.4 mg under the tongue  every 5 (five) minutes as needed.        . ezetimibe (ZETIA) 10 MG tablet Take 1 tablet (10 mg total) by mouth daily.  30 tablet  6  . DISCONTD: carvedilol (COREG) 25 MG tablet Take 25 mg by mouth 2 (two) times daily with a meal.         Past Medical History  Diagnosis Date  . Coronary artery disease     Small diagonal subtotally occluded. Cx orginating from the RCA.  . Cocaine abuse     Remote history  . Hypertension   . Hyperlipidemia   . Diabetes mellitus   . Psoriasis   . Gout     Past Surgical History  Procedure Date  . None     EAV:[id number] of systems complete and found to be negative unless listed above PHYSICAL EXAM BP 130/77  Pulse 96  Ht 5\' 10"  (1.778 m)  Wt 219 lb (99.338 kg)  BMI 31.42 kg/m2  SpO2 96% General: Well developed, well nourished, in no acute distress Head: Eyes PERRLA, + xanthomas.   Normal cephalic and atramatic  Lungs: Clear bilaterally to auscultation and percussion. Heart:  HRRR S1 S2, .  Pulses are 2+ & equal.            No carotid bruit. No JVD.  No abdominal bruits. No femoral bruits. Abdomen: Bowel sounds are positive, abdomen soft and non-tender without masses or                  Hernia's noted. Obese with 2+ bowel sounds. Msk:  Back normal, normal gait. Normal strength and tone for age. Extremities: No clubbing, cyanosis or edema.  DP +1 Psoriasis plaques are noted on toro and arms. Neuro: Alert and oriented X 3. Psych:  Good affect, responds appropriately  EKG: NSR rate of 91 bpm.  Nonspecific anterior abnormalities.  ASSESSMENT AND PLAN

## 2010-08-02 NOTE — Assessment & Plan Note (Signed)
Well controlled.  No changes in medications for this.

## 2010-08-02 NOTE — Assessment & Plan Note (Signed)
He denies cardiac symptoms but continues to be noncompliant with risk stratification,  Diet, exercise, play a large part in this.  He is compliant with his medications.  I will get copies of his most recent cholesterol studies. He is advised to continue to increase his activity and watch his diet.    BP is well controlled.on current medications.  Continue to follow in 6 months.

## 2010-08-02 NOTE — Patient Instructions (Signed)
Your physician recommends that you schedule a follow-up appointment in:6 MONTHS Your physician has recommended you make the following change in your medication: ZETIA 10MG  DAILY

## 2010-08-09 ENCOUNTER — Other Ambulatory Visit: Payer: Self-pay | Admitting: *Deleted

## 2010-08-19 ENCOUNTER — Other Ambulatory Visit: Payer: Self-pay | Admitting: Cardiology

## 2010-08-19 MED ORDER — LISINOPRIL 10 MG PO TABS: 10.0000 mg | ORAL_TABLET | Freq: Every day | ORAL | Status: DC

## 2010-08-19 NOTE — Telephone Encounter (Signed)
.   Requested Prescriptions   Pending Prescriptions Disp Refills  . LISINOPRIL 10 MG PO TABS 90 tablet 6    Sig: Take 1 tablet (10 mg total) by mouth daily.

## 2010-09-13 ENCOUNTER — Other Ambulatory Visit: Payer: Self-pay | Admitting: Cardiology

## 2010-11-04 ENCOUNTER — Other Ambulatory Visit: Payer: Self-pay | Admitting: Cardiology

## 2011-06-08 ENCOUNTER — Ambulatory Visit (INDEPENDENT_AMBULATORY_CARE_PROVIDER_SITE_OTHER): Payer: Self-pay | Admitting: Adult Health

## 2011-06-08 ENCOUNTER — Encounter: Payer: Self-pay | Admitting: Adult Health

## 2011-06-08 VITALS — BP 144/92 | HR 79 | Resp 16 | Ht 70.0 in | Wt 214.0 lb

## 2011-06-08 DIAGNOSIS — I2510 Atherosclerotic heart disease of native coronary artery without angina pectoris: Secondary | ICD-10-CM

## 2011-06-08 DIAGNOSIS — E785 Hyperlipidemia, unspecified: Secondary | ICD-10-CM

## 2011-06-08 DIAGNOSIS — I10 Essential (primary) hypertension: Secondary | ICD-10-CM

## 2011-06-08 MED ORDER — NAPROXEN 500 MG PO TABS: 500.0000 mg | ORAL_TABLET | Freq: Two times a day (BID) | ORAL | Status: DC

## 2011-06-08 NOTE — Assessment & Plan Note (Signed)
He is stable from CV standpoint and asymptomatic.He will continue lisinopril, coreg, and isosorbide. We will see him in 1 year. He is to follow with Health Dept for  labs.

## 2011-06-08 NOTE — Assessment & Plan Note (Signed)
Reviewed recent cholesterol panel with patient. TC 171, TG 352, HDL 35, LDL 66. He is advised to be more mindful of diabetic diet and avoid ETOH. He is to follow with [facility] Dept for ongoing labs.

## 2011-06-08 NOTE — Patient Instructions (Signed)
Your physician recommends that you schedule a follow-up appointment in: 1 year  

## 2011-06-08 NOTE — Progress Notes (Signed)
HPI: [name] is a 51 y/o male patient of Dr. Andee Lineman we are seeing on annual follow-up with known history of mild nonobstructive CAD and a small diagonal that was subtotally occluded.  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  He is followed by [facility]. Health dept and has had recent cholesterol levels drawn.    He has had no admissions or ER visits. No new diagnosis. He has chronic right hip pain for which he is taken naproxen prn. He is in good spirits and is medically complaint.  No Known Allergies  Current Outpatient Prescriptions  Medication Sig Dispense Refill  . allopurinol (ZYLOPRIM) 100 MG tablet Take 100 mg by mouth daily.        Marland Kitchen aspirin 81 MG tablet Take 81 mg by mouth daily.      . calcipotriene-betamethasone (TACLONEX) ointment Apply topically daily.        . carvedilol (COREG) 6.25 MG tablet TAKE ONE TABLET BY MOUTH TWICE DAILY  60 tablet  6  . isosorbide mononitrate (IMDUR) 60 MG 24 hr tablet TAKE ONE TABLET BY MOUTH EVERY DAY  30 tablet  6  . lisinopril (PRINIVIL,ZESTRIL) 10 MG tablet Take 1 tablet (10 mg total) by mouth daily.  90 tablet  6  . metFORMIN (GLUMETZA) 500 MG (MOD) 24 hr tablet Take 500 mg by mouth daily with breakfast.        . naproxen (NAPROSYN) 500 MG tablet Take 1 tablet (500 mg total) by mouth 2 (two) times daily with a meal.  60 tablet  6  . nitroGLYCERIN (NITROSTAT) 0.4 MG SL tablet Place 1 tablet (0.4 mg total) under the tongue every 5 (five) minutes as needed.  25 tablet  3  . simvastatin (ZOCOR) 40 MG tablet TAKE ONE TABLET BY MOUTH AT BEDTIME  90 tablet  3    Past Medical History  Diagnosis Date  . Coronary artery disease     Small diagonal subtotally occluded. Cx orginating from the RCA.  . Cocaine abuse     Remote history  . Hypertension   . Hyperlipidemia   . Diabetes mellitus   . Psoriasis   . Gout     Past Surgical History  Procedure Date  . None     ZOX:WRUEAV of systems complete and found to be  negative unless listed above PHYSICAL EXAM BP 144/92  Pulse 79  Resp 16  Ht 5\' 10"  (1.778 m)  Wt 214 lb (97.07 kg)  BMI 30.71 kg/m2 General: Well developed, well nourished, in no acute distress Head: Eyes PERRLA, + xanthomas.   Normal cephalic and atramatic  Lungs: Clear bilaterally to auscultation and percussion. Heart: HRRR S1 S2, .  Pulses are 2+ & equal.            No carotid bruit. No JVD.  No abdominal bruits. No femoral bruits. Abdomen: Bowel sounds are positive, abdomen soft and non-tender without masses or                  Hernia's noted. Obese with 2+ bowel sounds. Msk:  Back normal, normal gait. Normal strength and tone for age. Extremities: No clubbing, cyanosis or edema.  DP +1 Psoriasis plaques are noted on toro and arms. Pain with ROM of the right hip. Neuro: Alert and oriented X 3. Psych:  Good affect, responds appropriately  EKG: NSR rate of 91 bpm.  Nonspecific anterior abnormalities.  ASSESSMENT AND PLAN

## 2011-09-26 ENCOUNTER — Other Ambulatory Visit: Payer: Self-pay | Admitting: Cardiology

## 2011-11-01 ENCOUNTER — Other Ambulatory Visit: Payer: Self-pay | Admitting: Cardiology

## 2011-11-01 NOTE — Telephone Encounter (Signed)
[place] Patient

## 2011-12-29 ENCOUNTER — Ambulatory Visit (HOSPITAL_COMMUNITY)
Admission: RE | Admit: 2011-12-29 | Discharge: 2011-12-29 | Disposition: A | Discharge status: Home / Self Care | Payer: Disability Insurance | Source: Ambulatory Visit | Attending: Ophthalmology | Admitting: Ophthalmology

## 2011-12-29 ENCOUNTER — Other Ambulatory Visit (HOSPITAL_COMMUNITY): Payer: Self-pay | Admitting: *Deleted

## 2011-12-29 DIAGNOSIS — M533 Sacrococcygeal disorders, not elsewhere classified: Secondary | ICD-10-CM | POA: Insufficient documentation

## 2011-12-29 DIAGNOSIS — M545 Low back pain, unspecified: Secondary | ICD-10-CM | POA: Insufficient documentation

## 2011-12-29 DIAGNOSIS — M25569 Pain in unspecified knee: Secondary | ICD-10-CM | POA: Insufficient documentation

## 2011-12-29 DIAGNOSIS — M79609 Pain in unspecified limb: Secondary | ICD-10-CM | POA: Insufficient documentation

## 2012-02-21 ENCOUNTER — Other Ambulatory Visit: Payer: Self-pay | Admitting: Adult Health

## 2012-05-26 ENCOUNTER — Other Ambulatory Visit: Payer: Self-pay | Admitting: Adult Health

## 2012-06-07 ENCOUNTER — Ambulatory Visit: Payer: Self-pay | Admitting: Adult Health

## 2012-06-12 ENCOUNTER — Other Ambulatory Visit: Payer: Self-pay | Admitting: Adult Health

## 2012-06-12 ENCOUNTER — Ambulatory Visit (INDEPENDENT_AMBULATORY_CARE_PROVIDER_SITE_OTHER): Payer: Medicaid Other | Admitting: Adult Health

## 2012-06-12 ENCOUNTER — Encounter: Payer: Self-pay | Admitting: Adult Health

## 2012-06-12 VITALS — BP 152/90 | HR 87 | Ht 70.0 in | Wt 218.0 lb

## 2012-06-12 DIAGNOSIS — E7800 Pure hypercholesterolemia, unspecified: Secondary | ICD-10-CM

## 2012-06-12 DIAGNOSIS — I10 Essential (primary) hypertension: Secondary | ICD-10-CM

## 2012-06-12 DIAGNOSIS — Z79899 Other long term (current) drug therapy: Secondary | ICD-10-CM

## 2012-06-12 DIAGNOSIS — E785 Hyperlipidemia, unspecified: Secondary | ICD-10-CM

## 2012-06-12 MED ORDER — ISOSORBIDE MONONITRATE ER 60 MG PO TB24: ORAL_TABLET | ORAL | Status: DC

## 2012-06-12 MED ORDER — CARVEDILOL 6.25 MG PO TABS: ORAL_TABLET | ORAL | Status: DC

## 2012-06-12 MED ORDER — LISINOPRIL 10 MG PO TABS: ORAL_TABLET | ORAL | Status: DC

## 2012-06-12 MED ORDER — CALCIPOTRIENE-BETAMETH DIPROP 0.005-0.064 % EX OINT: TOPICAL_OINTMENT | Freq: Every day | CUTANEOUS | Status: DC

## 2012-06-12 MED ORDER — SIMVASTATIN 40 MG PO TABS: 40.0000 mg | ORAL_TABLET | Freq: Every day | ORAL | Status: DC

## 2012-06-12 MED ORDER — NITROGLYCERIN 0.4 MG SL SUBL: 0.4000 mg | SUBLINGUAL_TABLET | SUBLINGUAL | Status: AC | PRN

## 2012-06-12 MED ORDER — TRAMADOL HCL 50 MG PO TABS: 50.0000 mg | ORAL_TABLET | Freq: Four times a day (QID) | ORAL | Status: DC | PRN

## 2012-06-12 NOTE — Progress Notes (Signed)
   HPI [name] is a 52 year old male patient, formerly of Dr. Andee Hayes we are following for ongoing assessment and management of CAD with mild nonobstructive, and hypertension. The patient was continued be treated medically and is also followed by [facility]. On last visit he was doing well without complaint.   He has recently received Medca[employer] and is now looking for a new physician. He has not had any complaints of chest pain, DOE or fatigue.Marland Kitchen He is medically compliant.  No Known Allergies  Current Outpatient Prescriptions  Medication Sig Dispense Refill  . allopurinol (ZYLOPRIM) 100 MG tablet Take 100 mg by mouth daily.        Marland Kitchen aspirin 81 MG tablet Take 81 mg by mouth daily.      . calcipotriene-betamethasone (TACLONEX) ointment Apply topically daily.        . carvedilol (COREG) 6.25 MG tablet TAKE ONE TABLET BY MOUTH TWICE DAILY  60 tablet  6  . isosorbide mononitrate (IMDUR) 60 MG 24 hr tablet TAKE ONE TABLET BY MOUTH EVERY DAY  30 tablet  6  . lisinopril (PRINIVIL,ZESTRIL) 10 MG tablet TAKE ONE TABLET BY MOUTH EVERY DAY  30 tablet  6  . metFORMIN (GLUMETZA) 500 MG (MOD) 24 hr tablet Take 500 mg by mouth daily with breakfast.        . naproxen (NAPROSYN) 500 MG tablet TAKE ONE TABLET BY MOUTH TWICE DAILY WITH A MEAL  60 tablet  3  . nitroGLYCERIN (NITROSTAT) 0.4 MG SL tablet Place 1 tablet (0.4 mg total) under the tongue every 5 (five) minutes as needed.  25 tablet  3  . Red Yeast Rice 600 MG CAPS Take 1,200 mg by mouth daily.      . simvastatin (ZOCOR) 40 MG tablet TAKE ONE TABLET BY MOUTH AT BEDTIME  90 tablet  3   No current facility-administered medications for this visit.    Past Medical History  Diagnosis Date  . Coronary artery disease     Small diagonal subtotally occluded. Cx orginating from the RCA.  . Cocaine abuse     Remote history  . Hypertension   . Hyperlipidemia   . Diabetes mellitus   . Psoriasis   . Gout     Past Surgical History    Procedure Laterality Date  . None      WUJ:WJXBJY of systems complete and found to be negative unless listed above  PHYSICAL EXAM BP 152/90  Pulse 87  Ht 5\' 10"  (1.778 m)  Wt 218 lb (98.884 kg)  BMI 31.28 kg/m2 General: Well developed, well nourished, obese in no acute distress Head: Eyes PERRLA, No xanthomas.   Normal cephalic and atramatic  Lungs: Clear bilaterally to auscultation and percussion. Heart: HRRR S1 S2, without MRG.  Pulses are 2+ & equal.            No carotid bruit. No JVD.  No abdominal bruits. No femoral bruits. Abdomen: Bowel sounds are positive, abdomen soft and non-tender without masses or  Hernia's noted.Obese. Msk:  Back normal, normal gait. Normal strength and tone for age. Area of psoriasis plaque on arms and lower legs. Extremities: No clubbing, cyanosis or edema.  DP +1 Neuro: Alert and oriented X 3. Psych:  Good affect, responds appropriately  EKG:NSR with non-specific T-wave abnormalities abnormality.   ASSESSMENT AND PLAN

## 2012-06-12 NOTE — Assessment & Plan Note (Signed)
Mildly elevated on this visit Recheck has him at 140/ 76. Will continue to monitor this. He will need to be established with PCP who accepts Medicaid.

## 2012-06-12 NOTE — Assessment & Plan Note (Signed)
He is stable from cardiac standpoint He will continue risk management. He is to have lipids checked for ongoing evaluation. Continue on ACE inhibitor and simvastatin.

## 2012-06-12 NOTE — Assessment & Plan Note (Signed)
Fasting lipids and LFTs are ordered. He is taking Red Yeast Rice as well.

## 2012-06-12 NOTE — Patient Instructions (Addendum)
The current medical regimen is effective;  continue present plan and medications.  Please return fasting for blood work (lipid and liver profile) within the next 2 weeks.  Please establish with a primary care doctor. Quintin Alto [phone] ([facility]) Samuel Jester  [phone]) Greg Napier  [phone] ([address]) Elizabeth Palau  336 [phone]  [facility]) all accept Medicaid.  Follow up in 1 year with Joni Reining, NP  You will receive a letter in the mail 2 months before you are due.  Please call us when you receive this letter to schedule your follow up appointment.

## 2012-06-13 NOTE — Addendum Note (Signed)
Addended by: Derry Lory A on: 06/13/2012 10:52 AM   Modules accepted: Orders

## 2012-06-14 ENCOUNTER — Other Ambulatory Visit: Payer: Self-pay | Admitting: Adult Health

## 2012-06-16 LAB — LIPID PANEL
HDL: 34 mg/dL — ABNORMAL LOW (ref >39)
Total CHOL/HDL Ratio: 5.8 Ratio

## 2012-06-16 LAB — HEPATIC FUNCTION PANEL
Albumin: 3.7 g/dL (ref 3.5–5.2)
Alkaline Phosphatase: 49 U/L (ref 39–117)
Bilirubin, Direct: 0.1 mg/dL (ref 0.0–0.3)
Total Bilirubin: 0.3 mg/dL (ref 0.3–1.2)

## 2012-06-25 ENCOUNTER — Other Ambulatory Visit: Payer: Self-pay | Admitting: Adult Health

## 2012-06-26 NOTE — Telephone Encounter (Signed)
[name] this pt is requesting a refill on Noprosyn. Please review and determine if you will refill this medication.

## 2012-06-28 ENCOUNTER — Telehealth: Payer: Self-pay | Admitting: *Deleted

## 2012-06-28 NOTE — Telephone Encounter (Signed)
PT NEEDS NAPROXEN CALLED IN TO [facility] IN [place]

## 2012-06-28 NOTE — Telephone Encounter (Signed)
He will need to address this with PCP. With hypertension, I asked him not to take this consistently.

## 2012-06-28 NOTE — Telephone Encounter (Signed)
Please advise if ok to send medication

## 2012-06-29 MED ORDER — NAPROXEN 500 MG PO TABS: 500.0000 mg | ORAL_TABLET | Freq: Two times a day (BID) | ORAL | Status: DC

## 2012-06-29 NOTE — Telephone Encounter (Signed)
rx sent to pharmacy by e-script For #60 no refills, placed notation on medication refill for pt to have last refill from [facility] and no more in future, will have to establish with PCP going forward, left message to advise pt verbally

## 2012-06-29 NOTE — Telephone Encounter (Signed)
Refill denied 

## 2012-06-29 NOTE — Telephone Encounter (Signed)
Ok to call in, but no refills. Need to have PCP handle this medication. Will not be giving Rx for more refills. Advise pat of this.

## 2012-06-29 NOTE — Addendum Note (Signed)
Addended by: Derry Lory A on: 06/29/2012 05:55 PM   Modules accepted: Orders

## 2013-02-18 ENCOUNTER — Other Ambulatory Visit: Payer: Self-pay | Admitting: Adult Health

## 2013-06-18 ENCOUNTER — Other Ambulatory Visit: Payer: Self-pay | Admitting: Adult Health

## 2013-06-19 ENCOUNTER — Telehealth: Payer: Self-pay | Admitting: Adult Health

## 2013-06-19 NOTE — Telephone Encounter (Signed)
Refused diabetic medication, courtesy until seen by PCP. Pt had plenty of time to be seen by a PCP .

## 2013-06-19 NOTE — Telephone Encounter (Signed)
Received fax refill request  Rx # [id number] Medication:  Metformin 500 mg tab Qty 30 Sig:  Take one tablet bymouth once daily Physician:  Lyman Bishop

## 2013-07-25 ENCOUNTER — Encounter: Payer: Self-pay | Admitting: Cardiology

## 2013-07-25 ENCOUNTER — Ambulatory Visit (INDEPENDENT_AMBULATORY_CARE_PROVIDER_SITE_OTHER): Payer: Medicaid Other | Admitting: Cardiology

## 2013-07-25 VITALS — BP 126/82 | HR 84 | Ht 70.0 in | Wt 195.0 lb

## 2013-07-25 DIAGNOSIS — I10 Essential (primary) hypertension: Secondary | ICD-10-CM

## 2013-07-25 DIAGNOSIS — I2510 Atherosclerotic heart disease of native coronary artery without angina pectoris: Secondary | ICD-10-CM

## 2013-07-25 DIAGNOSIS — E785 Hyperlipidemia, unspecified: Secondary | ICD-10-CM

## 2013-07-25 MED ORDER — CARVEDILOL 6.25 MG PO TABS: 6.2500 mg | ORAL_TABLET | Freq: Two times a day (BID) | ORAL | Status: DC

## 2013-07-25 MED ORDER — LISINOPRIL 10 MG PO TABS: 10.0000 mg | ORAL_TABLET | Freq: Every day | ORAL | Status: DC

## 2013-07-25 NOTE — Progress Notes (Signed)
Clinical Summary [name] is a 53 y.o.male last seen by NP Lyman BishopLawrence, this is our first visit together. He is seen for the following medical problems.   1. 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 LVEF 50% by LV gram. Patient had reported recent cocaine exposure at that time, due to small vessel disease and concern for compliance was medically managed.  - denies any chest pain, no SOB or DOE - compliant with meds. Walks up flight of stairs regularly without limitation.   2. HTN - checks at home often, 120s/70s - compliant with meds  3. Hyperlipidemia - 05/2012 TC 196 TGs 431 HDL 34 LDL no calculated. Mixed compliance with statin.  - reports recent panel by pcp   Past Medical History  Diagnosis Date  . Coronary artery disease     Small diagonal subtotally occluded. Cx orginating from the RCA.  . Cocaine abuse     Remote history  . Hypertension   . Hyperlipidemia   . Diabetes mellitus   . Psoriasis   . Gout      No Known Allergies   Current Outpatient Prescriptions  Medication Sig Dispense Refill  . allopurinol (ZYLOPRIM) 100 MG tablet Take 100 mg by mouth daily.        Marland Kitchen. aspirin 81 MG tablet Take 81 mg by mouth daily.      . calcipotriene-betamethasone (TACLONEX) ointment Apply topically daily. ONE TIME ONLY  PLEASE OBTAIN FURTHER REFILLS FROM PCP  60 g  0  . carvedilol (COREG) 6.25 MG tablet TAKE ONE TABLET BY MOUTH TWICE DAILY  30 tablet  0  . isosorbide mononitrate (IMDUR) 60 MG 24 hr tablet TAKE ONE TABLET BY MOUTH ONCE DAILY  30 tablet  6  . lisinopril (PRINIVIL,ZESTRIL) 10 MG tablet TAKE ONE TABLET BY MOUTH ONCE DAILY  30 tablet  6  . metFORMIN (GLUMETZA) 500 MG (MOD) 24 hr tablet Take 500 mg by mouth daily with breakfast.        . naproxen (NAPROSYN) 500 MG tablet Take 1 tablet (500 mg  total) by mouth 2 (two) times daily with a meal.  60 tablet  0  . nitroGLYCERIN (NITROSTAT) 0.4 MG SL tablet Place 1 tablet (0.4 mg total) under the tongue every 5 (five) minutes as needed.  25 tablet  3  . Red Yeast Rice 600 MG CAPS Take 1,200 mg by mouth daily.      . simvastatin (ZOCOR) 40 MG tablet Take 1 tablet (40 mg total) by mouth at bedtime.  30 tablet  6  . traMADol (ULTRAM) 50 MG tablet Take 1 tablet (50 mg total) by mouth every 6 (six) hours as needed for pain.  90 tablet  0   No current facility-administered medications for this visit.     Past Surgical History  Procedure Laterality Date  . None       No Known Allergies    No family history on file.   Social History [name] reports that he has quit smoking. He does not have any smokeless tobacco history on file. [name] reports that he drinks alcohol.   Review of Systems CONSTITUTIONAL: No weight loss, fever, chills, weakness or fatigue.  HEENT: Eyes: No visual loss, blurred vision, double vision or yellow sclerae.No hearing loss, sneezing, congestion, runny nose  or sore throat.  SKIN: No rash or itching.  CARDIOVASCULAR: per HPI RESPIRATORY: No shortness of breath, cough or sputum.  GASTROINTESTINAL: No anorexia, nausea, vomiting or diarrhea. No abdominal pain or blood.  GENITOURINARY: No burning on urination, no polyuria NEUROLOGICAL: No headache, dizziness, syncope, paralysis, ataxia, numbness or tingling in the extremities. No change in bowel or bladder control.  MUSCULOSKELETAL: hip pain LYMPHATICS: No enlarged nodes. No history of splenectomy.  PSYCHIATRIC: No history of depression or anxiety.  ENDOCRINOLOGIC: No reports of sweating, cold or heat intolerance. No polyuria or polydipsia.  Marland Kitchen   Physical Examination p 84 bp 126/82 Wt 195 lbs BMI 28 Gen: resting comfortably, no acute distress HEENT: no scleral icterus, pupils equal round and reactive, no palptable cervical adenopathy,  CV: RRR,  no m/r/g, no JVD Resp: Clear to auscultation bilaterally GI: abdomen is soft, non-tender, non-distended, normal bowel sounds, no hepatosplenomegaly MSK: extremities are warm, no edema.  Skin: warm, no rash Neuro:  no focal deficits Psych: appropriate affect   Diagnostic Studies 05/2010 Cath HEMODYNAMIC DATA:  1. The central aortic pressure is 139/96, mean of 17.  2. Left ventricular pressure 148/12.  3. There was no gradient or pullback across the aortic valve.  ANGIOGRAPHIC DATA:  1. Ventriculography done in the RAO projection revealed vigorous  global systolic function. Ejection fraction to be estimated 50%.  I did not appreciate a definite wall motion abnormality.  2. The left main coronary artery demonstrates some mild luminal  irregularity throughout. There is a mid area of plaque  approximately 20%. In addition, there is a small diagonal branch  that basically appears to be totally occluded and filling in late.  The mid distal portion of the left anterior descending artery has  luminal irregularities but no critical stenoses. Dr. Shirlee Latch and I  thought that the diagonal could likely represent the acute infarct  vessel.  3. There is a ramus intermedius vessel that bifurcates. There is a  more medial bifurcation vessel that has probably 75% narrowing.  There is slight decrease density in the LAO caudal view, but in  other views, it appears to be relatively smooth and eccentric. The  artery distal to that bifurcates and has a fair amount of diffuse  luminal irregularity.  4. The right coronary artery provides the takeoff of the RCA, and also  the takeoff of a circumflex marginal with an anomalous origin.  There is a common ostium. The RCA itself has about 30% narrowing  near the proximal portion and then a 40% to 50% area of segmental  narrowing near the proximal mid junction. Distally, there is a 50-  60% area of fairly smooth vessel. More distal to this, there is a    posterior descending, posterolateral system with mild luminal  irregularity but no critical stenosis.  5. 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  One branch of the bifurcating vessel has about 50% narrowing in its  ostium and the other branch is totally occluded and fills by  retrograde collaterals.  CONCLUSIONS:  1. Preserved overall left ventricular function.  2. Total occlusion of the diagonal, which likely represents the acute  infarct vessel.  3. Total occlusion of a circumflex subbranch with retrograde  collateralization.  4. A 60-70% mid right coronary artery stenosis.  5. Moderately high-grade stenosis of the ramus intermedius.  PLAN: The patient has had recent cocaine exposure. At the present  time, my initial plan would be to treat him medically. He will  be put  on aspirin, Plavix, beta-blockade, as well as low-dose nitrates. We  will let the cocaine get out of his system. He will be treated with the  medication. We will try to get him to stop smoking. I may well  consider bringing him back for repeat angiography to see if the lesions  of the mid right and circumflex have improved. We may consider him as a  candidate for percutaneous intervention, but I would be somewhat  reluctant to do, as I am not sure about his medical compliance with a  dual antiplatelet regimen, and also I am not convinced that he would be  a good candidate for drug-eluting stent implantation. The RCA would  likely involve a long territory of treatment. We will continue to  review these options with the patient.    07/25/13 EKG NSR, chronic non-specific ST/T changes  Assessment and Plan  1. CAD - no current symptoms, continue risk factor modification and secondary prevention  2. HTN - at goal, conttinue current meds  3. Hyperlipidemia - no recent panel in our system, will request most recent labs from pcp - elevated TGs from a year ago, he reports significant dietary  changes. Unclear how well his DM is controlled. F/u labs  F/u 1 year      Antoine PocheJonathan F. Branch, M.D., F.A.C.C.

## 2013-07-25 NOTE — Patient Instructions (Signed)
Your physician wants you to follow-up in: 1 year You will receive a reminder letter in the mail two months in advance. If you don't receive a letter, please call our office to schedule the follow-up appointment.    Your physician recommends that you continue on your current medications as directed. Please refer to the Current Medication list given to you today.     Thank you for choosing [facility] !  

## 2013-08-15 ENCOUNTER — Other Ambulatory Visit: Payer: Self-pay | Admitting: Adult Health

## 2013-09-24 ENCOUNTER — Other Ambulatory Visit: Payer: Self-pay | Admitting: Adult Health

## 2014-07-19 IMAGING — CR DG HIP COMPLETE 2+V*R*
3 series · 3 of 3 positions shown · non-contrast
Comparison: None.

CLINICAL DATA: Right hip and leg pain.

RIGHT HIP - COMPLETE 2+ VIEW

[view not recorded (1 of 3)]
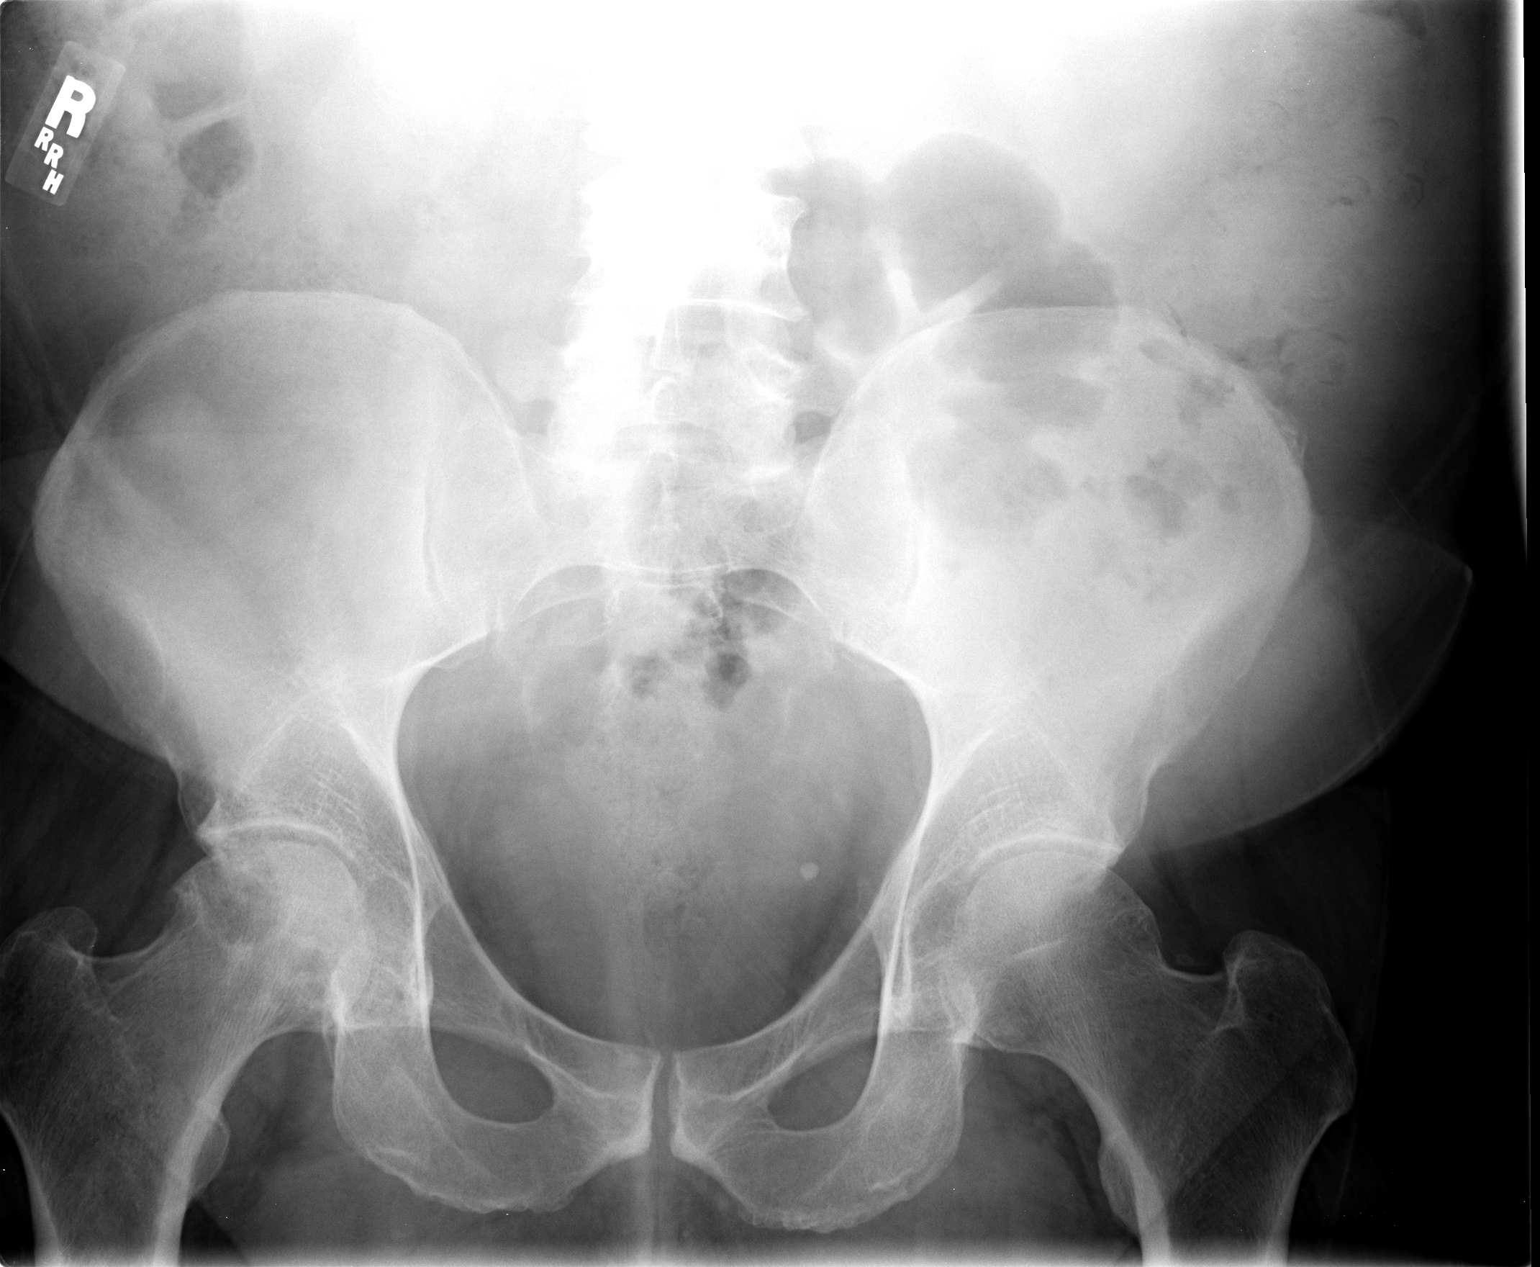

[view not recorded (2 of 3)]
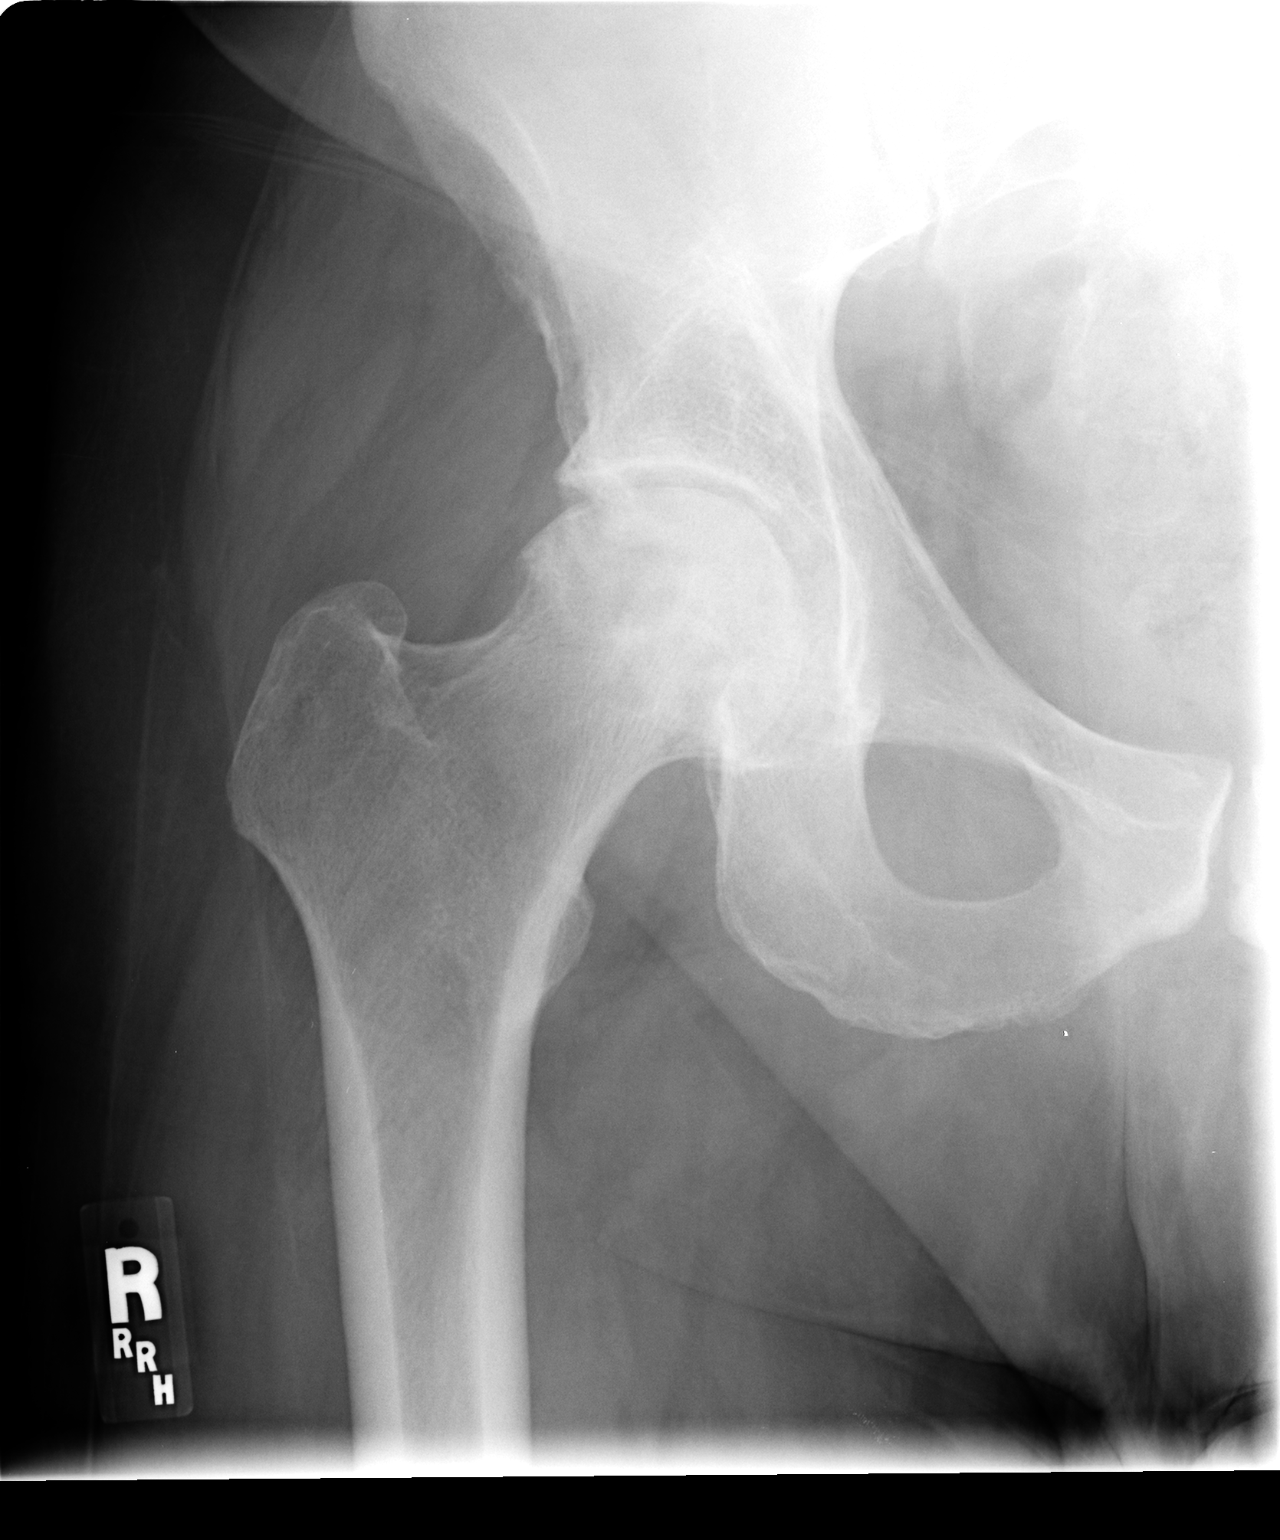

[view not recorded (3 of 3)]
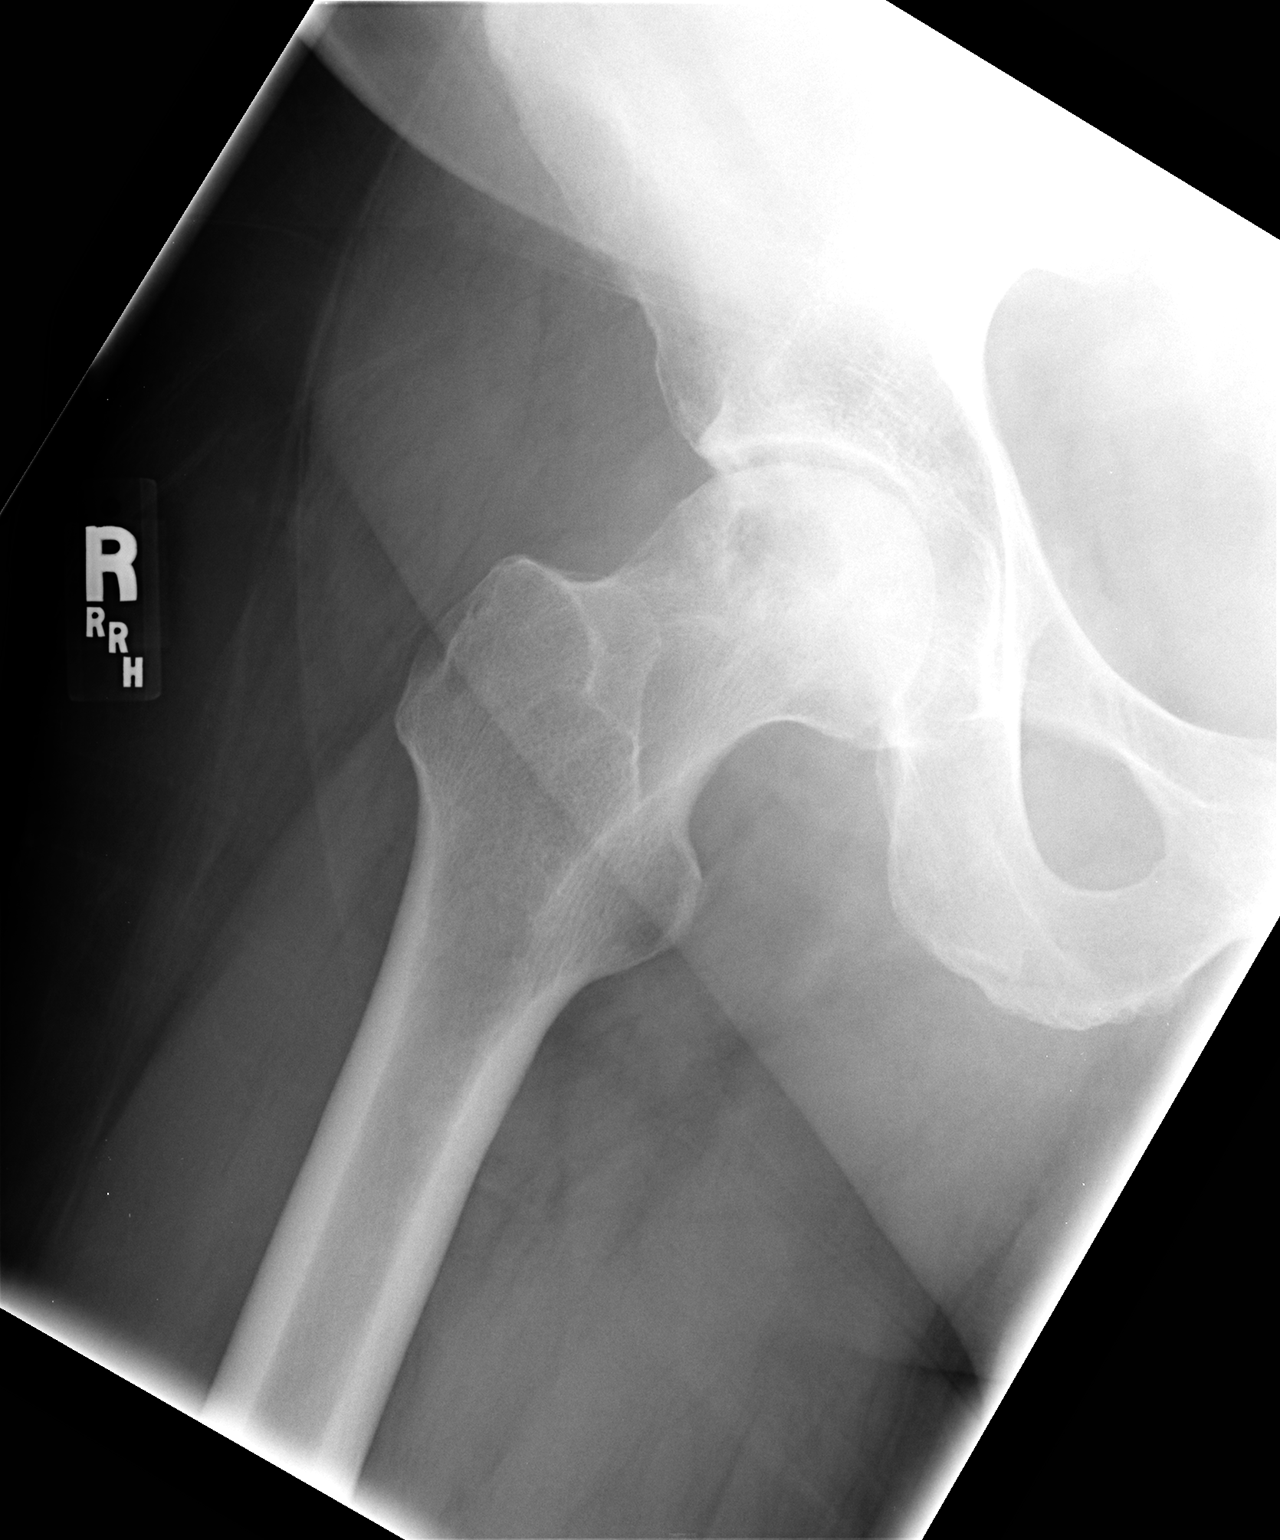

[3 of 3 positions shown; findings below may reference images not displayed]

FINDINGS: There is no acute bony or joint abnormality.  There is
increased density in the right femoral head with subtle flattening
present worrisome for avascular necrosis.  Moderate right hip
degenerative change is present.  The left hip is unremarkable.
IMPRESSION: Findings suspicious for avascular necrosis of the right hip with
secondary degenerative disease.

## 2014-07-19 IMAGING — CR DG LUMBAR SPINE 2-3V
3 series · 3 of 3 positions shown · non-contrast
Comparison: None

CLINICAL DATA: Right hip and leg pain

LUMBAR SPINE - 2-3 VIEW

[view not recorded (1 of 3)]
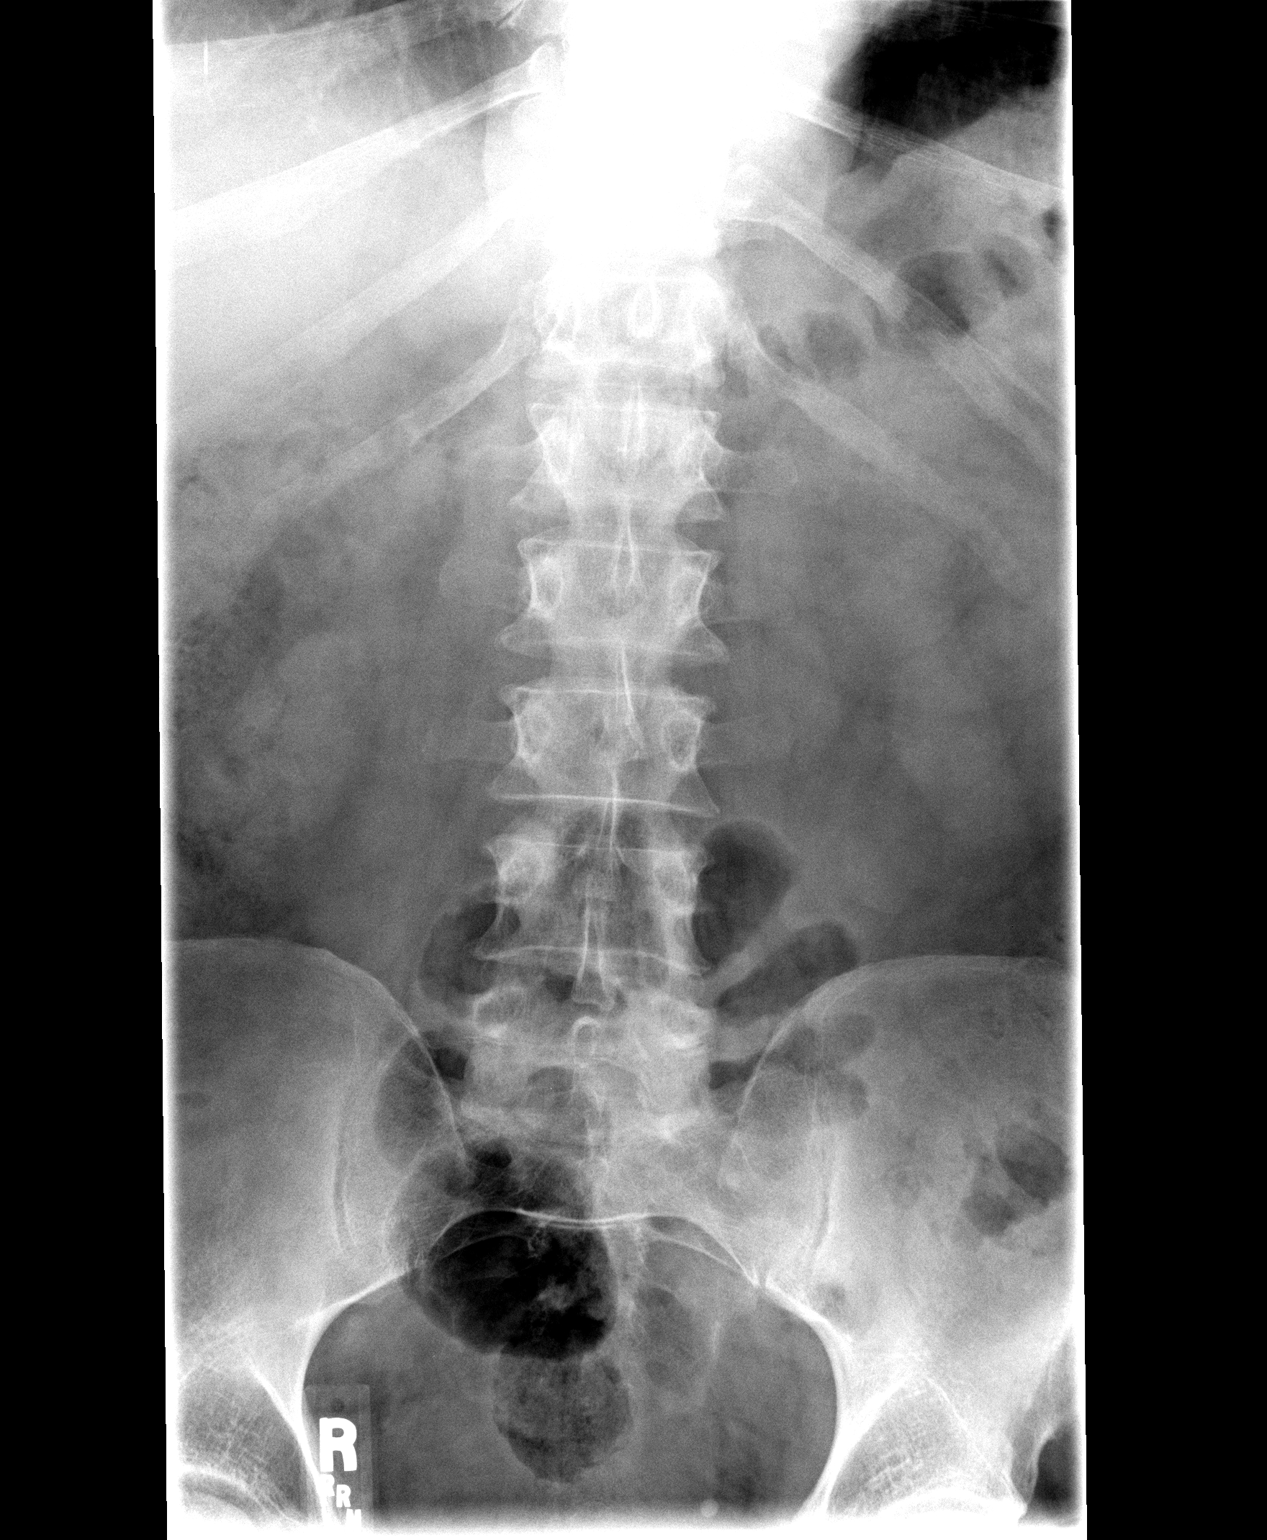

[view not recorded (2 of 3)]
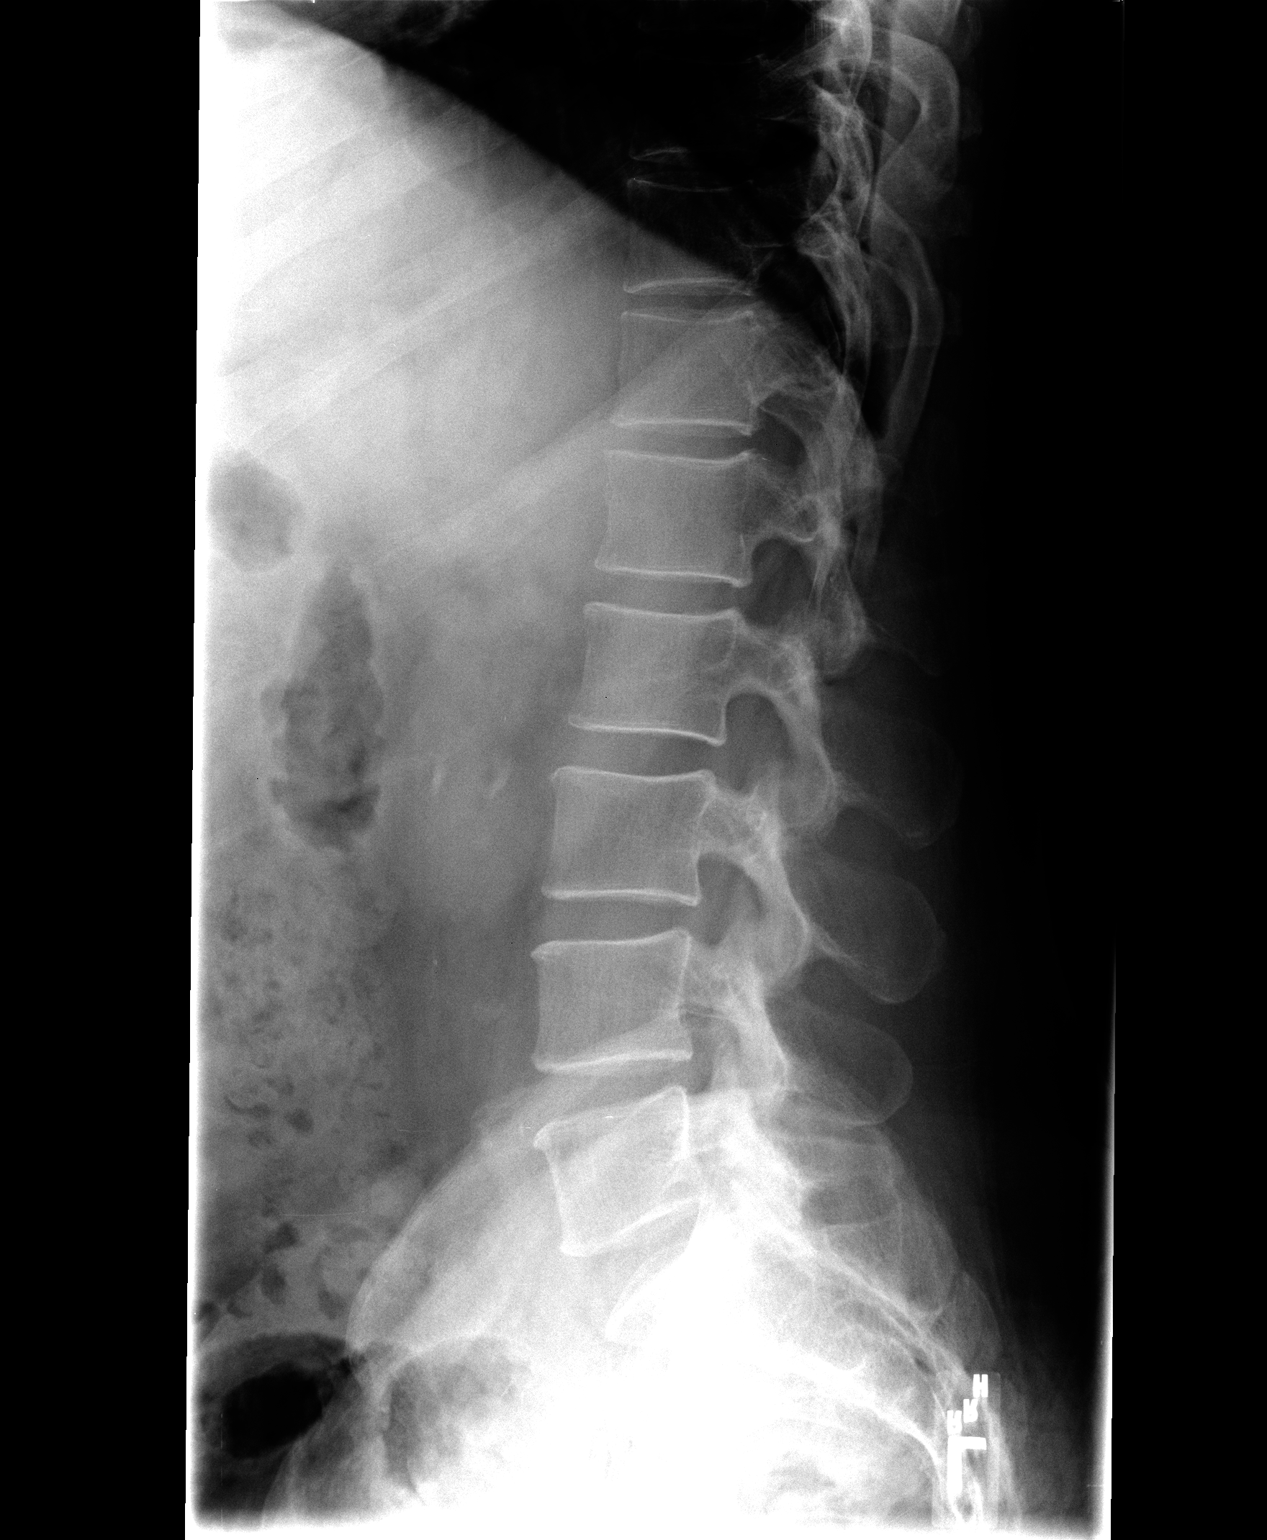

[view not recorded (3 of 3)]
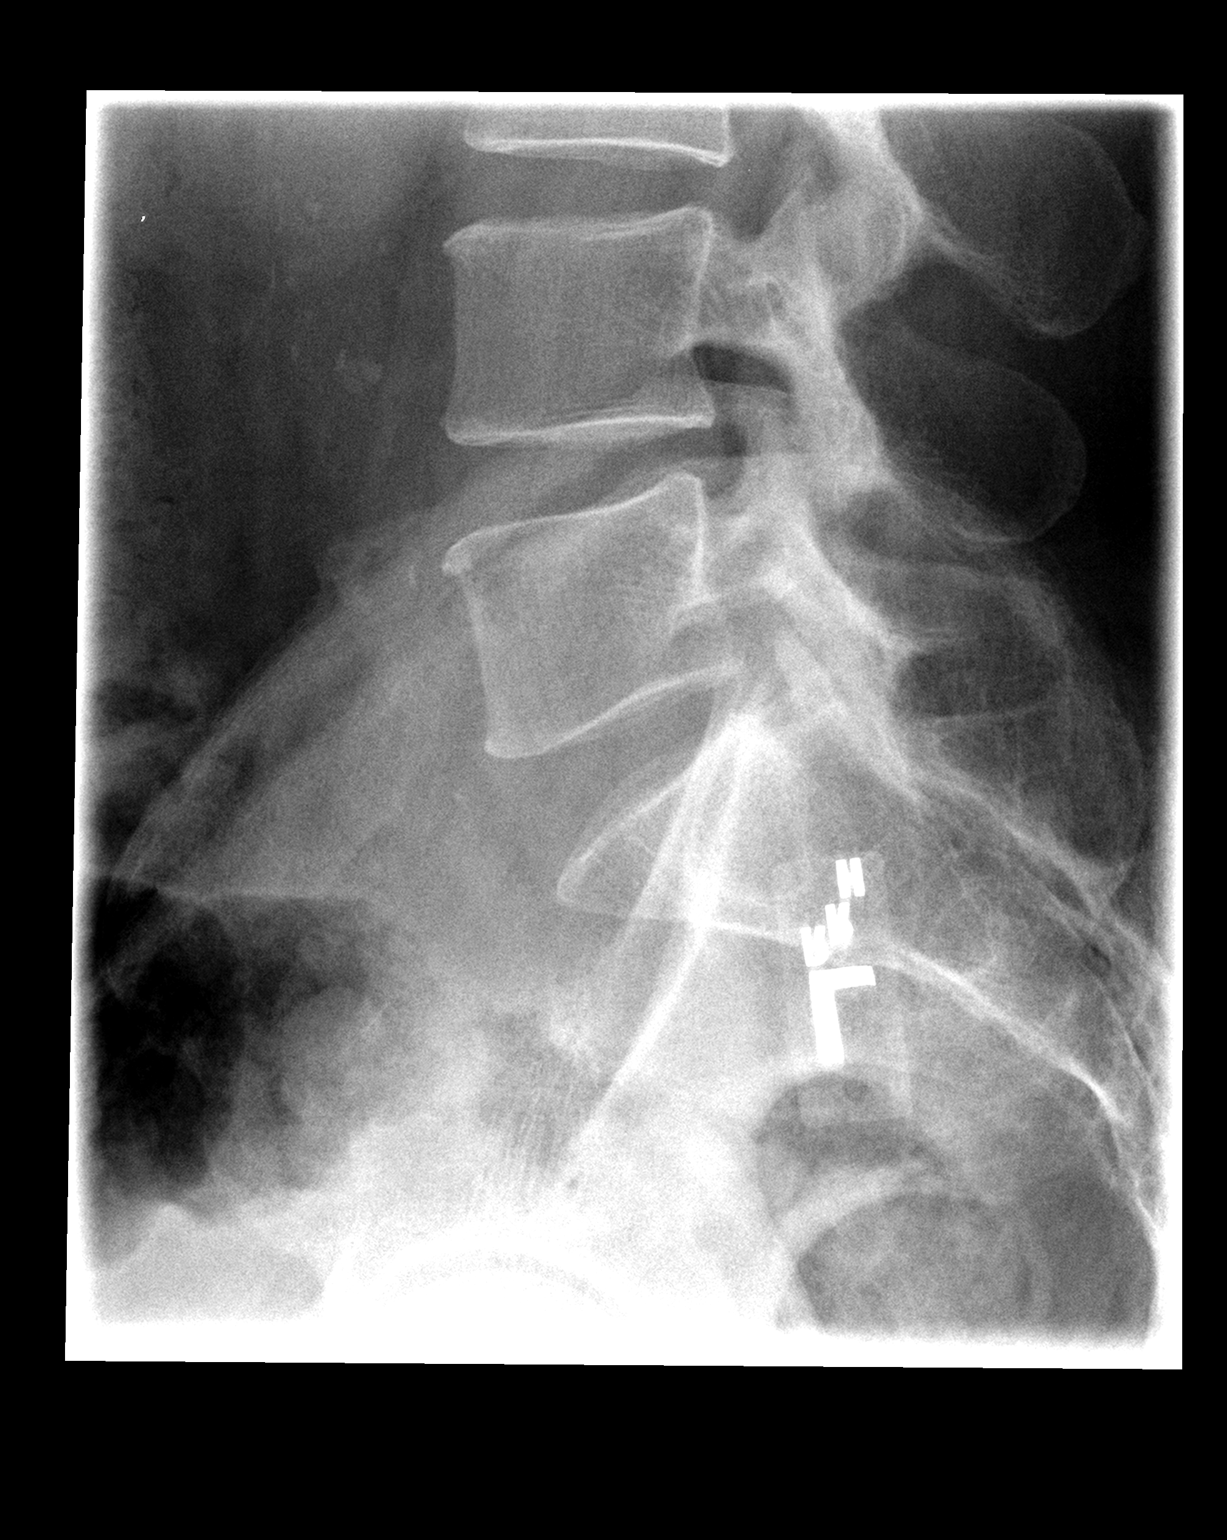

[3 of 3 positions shown; findings below may reference images not displayed]

FINDINGS: Five non-rib bearing lumbar vertebrae.
Vertebral body and disc space heights maintained.
SI joints symmetric.
No acute fracture, subluxation, or bone destruction.
Minimal atherosclerotic calcification aorta.
Left pelvic phleboliths.
IMPRESSION: No acute osseous abnormalities.

## 2021-10-02 ENCOUNTER — Encounter (HOSPITAL_COMMUNITY): Payer: Self-pay

## 2021-10-02 ENCOUNTER — Other Ambulatory Visit: Payer: Self-pay

## 2021-10-02 ENCOUNTER — Inpatient Hospital Stay (HOSPITAL_COMMUNITY)
Admission: EM | Admit: 2021-10-02 | Discharge: 2021-10-05 | DRG: 394 | Discharge status: Against Medical Advice | Payer: Medicaid Other | Attending: Family Medicine | Admitting: Family Medicine

## 2021-10-02 DIAGNOSIS — K4030 Unilateral inguinal hernia, with obstruction, without gangrene, not specified as recurrent: Secondary | ICD-10-CM | POA: Diagnosis present

## 2021-10-02 DIAGNOSIS — M549 Dorsalgia, unspecified: Secondary | ICD-10-CM | POA: Diagnosis present

## 2021-10-02 DIAGNOSIS — Z7982 Long term (current) use of aspirin: Secondary | ICD-10-CM

## 2021-10-02 DIAGNOSIS — L03115 Cellulitis of right lower limb: Secondary | ICD-10-CM | POA: Diagnosis not present

## 2021-10-02 DIAGNOSIS — F1010 Alcohol abuse, uncomplicated: Secondary | ICD-10-CM | POA: Diagnosis present

## 2021-10-02 DIAGNOSIS — M7989 Other specified soft tissue disorders: Secondary | ICD-10-CM | POA: Diagnosis present

## 2021-10-02 DIAGNOSIS — I10 Essential (primary) hypertension: Secondary | ICD-10-CM | POA: Diagnosis present

## 2021-10-02 DIAGNOSIS — E46 Unspecified protein-calorie malnutrition: Secondary | ICD-10-CM | POA: Diagnosis present

## 2021-10-02 DIAGNOSIS — M109 Gout, unspecified: Secondary | ICD-10-CM | POA: Diagnosis present

## 2021-10-02 DIAGNOSIS — Z6830 Body mass index (BMI) 30.0-30.9, adult: Secondary | ICD-10-CM

## 2021-10-02 DIAGNOSIS — N1330 Unspecified hydronephrosis: Secondary | ICD-10-CM | POA: Diagnosis present

## 2021-10-02 DIAGNOSIS — G8929 Other chronic pain: Secondary | ICD-10-CM | POA: Diagnosis present

## 2021-10-02 DIAGNOSIS — E1165 Type 2 diabetes mellitus with hyperglycemia: Secondary | ICD-10-CM | POA: Diagnosis present

## 2021-10-02 DIAGNOSIS — Z5329 Procedure and treatment not carried out because of patient's decision for other reasons: Secondary | ICD-10-CM | POA: Diagnosis present

## 2021-10-02 DIAGNOSIS — Z01810 Encounter for preprocedural cardiovascular examination: Secondary | ICD-10-CM | POA: Diagnosis not present

## 2021-10-02 DIAGNOSIS — D649 Anemia, unspecified: Secondary | ICD-10-CM | POA: Diagnosis present

## 2021-10-02 DIAGNOSIS — N5089 Other specified disorders of the male genital organs: Secondary | ICD-10-CM | POA: Diagnosis present

## 2021-10-02 DIAGNOSIS — K4090 Unilateral inguinal hernia, without obstruction or gangrene, not specified as recurrent: Secondary | ICD-10-CM

## 2021-10-02 DIAGNOSIS — E669 Obesity, unspecified: Secondary | ICD-10-CM | POA: Diagnosis present

## 2021-10-02 DIAGNOSIS — R7401 Elevation of levels of liver transaminase levels: Secondary | ICD-10-CM | POA: Diagnosis present

## 2021-10-02 DIAGNOSIS — Z8249 Family history of ischemic heart disease and other diseases of the circulatory system: Secondary | ICD-10-CM | POA: Diagnosis not present

## 2021-10-02 DIAGNOSIS — E871 Hypo-osmolality and hyponatremia: Secondary | ICD-10-CM | POA: Diagnosis present

## 2021-10-02 DIAGNOSIS — K429 Umbilical hernia without obstruction or gangrene: Secondary | ICD-10-CM | POA: Diagnosis present

## 2021-10-02 DIAGNOSIS — Z7984 Long term (current) use of oral hypoglycemic drugs: Secondary | ICD-10-CM

## 2021-10-02 DIAGNOSIS — F419 Anxiety disorder, unspecified: Secondary | ICD-10-CM | POA: Diagnosis present

## 2021-10-02 DIAGNOSIS — Z87891 Personal history of nicotine dependence: Secondary | ICD-10-CM | POA: Diagnosis not present

## 2021-10-02 DIAGNOSIS — R0609 Other forms of dyspnea: Secondary | ICD-10-CM | POA: Diagnosis not present

## 2021-10-02 DIAGNOSIS — I25119 Atherosclerotic heart disease of native coronary artery with unspecified angina pectoris: Secondary | ICD-10-CM | POA: Diagnosis not present

## 2021-10-02 DIAGNOSIS — E8809 Other disorders of plasma-protein metabolism, not elsewhere classified: Secondary | ICD-10-CM | POA: Diagnosis present

## 2021-10-02 DIAGNOSIS — Z79899 Other long term (current) drug therapy: Secondary | ICD-10-CM

## 2021-10-02 DIAGNOSIS — I2510 Atherosclerotic heart disease of native coronary artery without angina pectoris: Secondary | ICD-10-CM | POA: Diagnosis not present

## 2021-10-02 DIAGNOSIS — E785 Hyperlipidemia, unspecified: Secondary | ICD-10-CM | POA: Diagnosis present

## 2021-10-02 DIAGNOSIS — F1410 Cocaine abuse, uncomplicated: Secondary | ICD-10-CM | POA: Diagnosis present

## 2021-10-02 LAB — COMPREHENSIVE METABOLIC PANEL
ALT: 84 U/L — ABNORMAL HIGH (ref 0–44)
AST: 135 U/L — ABNORMAL HIGH (ref 15–41)
Albumin: 3.4 g/dL — ABNORMAL LOW (ref 3.5–5.0)
Alkaline Phosphatase: 106 U/L (ref 38–126)
Anion gap: 11 (ref 5–15)
BUN: 7 mg/dL — ABNORMAL LOW (ref 8–23)
CO2: 23 mmol/L (ref 22–32)
Calcium: 8.2 mg/dL — ABNORMAL LOW (ref 8.9–10.3)
Chloride: 91 mmol/L — ABNORMAL LOW (ref 98–111)
Creatinine, Ser: 0.83 mg/dL (ref 0.61–1.24)
GFR, Estimated: >60 mL/min (ref >60)
Glucose, Bld: 144 mg/dL — ABNORMAL HIGH (ref 70–99)
Potassium: 4.3 mmol/L (ref 3.5–5.1)
Sodium: 125 mmol/L — ABNORMAL LOW (ref 135–145)
Total Bilirubin: 0.8 mg/dL (ref 0.3–1.2)
Total Protein: 6.7 g/dL (ref 6.5–8.1)

## 2021-10-02 LAB — CBC WITH DIFFERENTIAL/PLATELET
Abs Immature Granulocytes: 0.03 10*3/uL (ref 0.00–0.07)
Basophils Absolute: 0 10*3/uL (ref 0.0–0.1)
Basophils Relative: 0 %
Eosinophils Absolute: 0 10*3/uL (ref 0.0–0.5)
Eosinophils Relative: 1 %
HCT: 37 % — ABNORMAL LOW (ref 39.0–52.0)
Hemoglobin: 12.7 g/dL — ABNORMAL LOW (ref 13.0–17.0)
Immature Granulocytes: 1 %
Lymphocytes Relative: 21 %
Lymphs Abs: 1.1 10*3/uL (ref 0.7–4.0)
MCH: 33 pg (ref 26.0–34.0)
MCHC: 34.3 g/dL (ref 30.0–36.0)
MCV: 96.1 fL (ref 80.0–100.0)
Monocytes Absolute: 0.4 10*3/uL (ref 0.1–1.0)
Monocytes Relative: 7 %
Neutro Abs: 3.8 10*3/uL (ref 1.7–7.7)
Neutrophils Relative %: 70 %
Platelets: 265 10*3/uL (ref 150–400)
RBC: 3.85 MIL/uL — ABNORMAL LOW (ref 4.22–5.81)
RDW: 13.7 % (ref 11.5–15.5)
WBC: 5.3 10*3/uL (ref 4.0–10.5)
nRBC: 0 % (ref 0.0–0.2)

## 2021-10-02 MED ORDER — ACETAMINOPHEN 650 MG RE SUPP: 650.0000 mg | Freq: Four times a day (QID) | RECTAL | Status: DC | PRN

## 2021-10-02 MED ORDER — ONDANSETRON HCL 4 MG/2ML IJ SOLN: 4.0000 mg | Freq: Four times a day (QID) | INTRAMUSCULAR | Status: DC | PRN

## 2021-10-02 MED ORDER — ONDANSETRON HCL 4 MG PO TABS: 4.0000 mg | ORAL_TABLET | Freq: Four times a day (QID) | ORAL | Status: DC | PRN

## 2021-10-02 MED ORDER — ACETAMINOPHEN 325 MG PO TABS: 650.0000 mg | ORAL_TABLET | Freq: Four times a day (QID) | ORAL | Status: DC | PRN

## 2021-10-02 MED ORDER — SODIUM CHLORIDE 0.9 % IV BOLUS
500.0000 mL | Freq: Once | INTRAVENOUS | Status: AC
  Administered 2021-10-02: 500 mL via INTRAVENOUS

## 2021-10-02 NOTE — ED Triage Notes (Signed)
Pt c/o hernia, states he having trouble using the bathroom because it has gotten so big. Pt also c/o bilateral leg swelling.

## 2021-10-02 NOTE — H&P (Incomplete)
  History and Physical    Patient: [name] ION:[id number] DOB: 09/16/1960 DOA: 10/02/2021 DOS: the patient was seen and examined on 10/02/2021 PCP: Toma Deiters, MD  Patient coming from: {Point_of_Origin:26777}  Chief Complaint:  Chief Complaint  Patient presents with   Hernia   HPI: [name] is a 61 y.o. male with medical history significant of ***  Review of Systems: {ROS_Text:26778} Past Medical History:  Diagnosis Date   Cocaine abuse (HCC)    Remote history   Coronary artery disease    Small diagonal subtotally occluded. Cx orginating from the RCA.   Diabetes mellitus    Gout    Hyperlipidemia    Hypertension    Psoriasis    Past Surgical History:  Procedure Laterality Date   none     Social History:  reports that he has quit smoking. He does not have any smokeless tobacco history on file. He reports current alcohol use. He reports current drug use. Drug: Cocaine.  No Known Allergies  No family history on file.  Prior to Admission medications   Medication Sig Start Date End Date Taking? Authorizing Provider  allopurinol (ZYLOPRIM) 100 MG tablet Take 100 mg by mouth daily.      [provider]  aspirin 81 MG tablet Take 81 mg by mouth daily.    [provider]  calcipotriene-betamethasone (TACLONEX) ointment Apply topically daily. ONE TIME ONLY  PLEASE OBTAIN FURTHER REFILLS FROM PCP 06/12/12   Jodelle Gross, NP  carvedilol (COREG) 6.25 MG tablet Take 1 tablet (6.25 mg total) by mouth 2 (two) times daily with a meal. 07/25/13   Antoine Poche, MD  isosorbide mononitrate (IMDUR) 60 MG 24 hr tablet TAKE ONE TABLET BY MOUTH ONCE DAILY 09/25/13   Jodelle Gross, NP  lisinopril (PRINIVIL,ZESTRIL) 10 MG tablet Take 1 tablet (10 mg total) by mouth daily. 07/25/13   Antoine Poche, MD  metFORMIN (GLUMETZA) 500 MG (MOD) 24 hr tablet Take 500 mg by mouth daily with breakfast.      [provider]  nitroGLYCERIN (NITROSTAT)  0.4 MG SL tablet Place 1 tablet (0.4 mg total) under the tongue every 5 (five) minutes as needed. 06/12/12   Jodelle Gross, NP  simvastatin (ZOCOR) 40 MG tablet TAKE ONE TABLET BY MOUTH AT BEDTIME 08/15/13   Antoine Poche, MD  traMADol (ULTRAM) 50 MG tablet Take 1 tablet (50 mg total) by mouth every 6 (six) hours as needed for pain. 06/12/12   Jodelle Gross, NP    Physical Exam: Vitals:   10/02/21 2100 10/02/21 2130 10/02/21 2200  BP: (!) 129/97 120/81 124/77  Pulse: 85 77 74  Resp: 16    Temp: 98 F (36.7 C)    TempSrc: Oral    SpO2: 98% 99% 98%  Weight: 97.5 kg    Height: 5' 10.5" (1.791 m)     *** Data Reviewed: {Tip this will not be part of the note when signed- Document your independent interpretation of telemetry tracing, EKG, lab, Radiology test or any other diagnostic tests. Add any new diagnostic test ordered today. (Optional):26781} {Results:26384}  Assessment and Plan: No notes have been filed under this hospital service. Service: Hospitalist     Advance Care Planning:   Code Status: Not on file ***  Consults: ***  Family Communication: ***  Severity of Illness: {Observation/Inpatient:21159}  Author: Frankey Shown, DO 10/02/2021 10:28 PM  For on call review [email or web address].

## 2021-10-02 NOTE — ED Provider Notes (Signed)
[facility] EMERGENCY DEPARTMENT Provider Note   CSN: [id number] Arrival date & time: 10/02/21  2036     History {Add pertinent medical, surgical, social history, OB history to HPI:1} Chief Complaint  Patient presents with   Hernia    [name] is a 61 y.o. male.  Patient has a history of hypertension diabetes coronary disease.  He comes in today complaining of swelling to his right leg which started today.  Patient also has a history of extremely large inguinal hernia that has made his scrotum size of a soccer ball   Leg Pain      Home Medications Prior to Admission medications   Medication Sig Start Date End Date Taking? Authorizing Provider  allopurinol (ZYLOPRIM) 100 MG tablet Take 100 mg by mouth daily.      [provider]  aspirin 81 MG tablet Take 81 mg by mouth daily.    [provider]  calcipotriene-betamethasone (TACLONEX) ointment Apply topically daily. ONE TIME ONLY  PLEASE OBTAIN FURTHER REFILLS FROM PCP 06/12/12   Jodelle Gross, NP  carvedilol (COREG) 6.25 MG tablet Take 1 tablet (6.25 mg total) by mouth 2 (two) times daily with a meal. 07/25/13   Antoine Poche, MD  isosorbide mononitrate (IMDUR) 60 MG 24 hr tablet TAKE ONE TABLET BY MOUTH ONCE DAILY 09/25/13   Jodelle Gross, NP  lisinopril (PRINIVIL,ZESTRIL) 10 MG tablet Take 1 tablet (10 mg total) by mouth daily. 07/25/13   Antoine Poche, MD  metFORMIN (GLUMETZA) 500 MG (MOD) 24 hr tablet Take 500 mg by mouth daily with breakfast.      [provider]  nitroGLYCERIN (NITROSTAT) 0.4 MG SL tablet Place 1 tablet (0.4 mg total) under the tongue every 5 (five) minutes as needed. 06/12/12   Jodelle Gross, NP  simvastatin (ZOCOR) 40 MG tablet TAKE ONE TABLET BY MOUTH AT BEDTIME 08/15/13   Antoine Poche, MD  traMADol (ULTRAM) 50 MG tablet Take 1 tablet (50 mg total) by mouth every 6 (six) hours as needed for pain. 06/12/12   Jodelle Gross, NP       Allergies    Patient has no known allergies.    Review of Systems   Review of Systems  Physical Exam Updated Vital Signs BP (!) 133/90   Pulse 72   Temp 98 F (36.7 C) (Oral)   Resp 16   Ht 5' 10.5" (1.791 m)   Wt 97.5 kg   SpO2 100%   BMI 30.41 kg/m  Physical Exam  ED Results / Procedures / Treatments   Labs (all labs ordered are listed, but only abnormal results are displayed) Labs Reviewed  CBC WITH DIFFERENTIAL/PLATELET - Abnormal; Notable for the following components:      Result Value   RBC 3.85 (*)    Hemoglobin 12.7 (*)    HCT 37.0 (*)    All other components within normal limits  COMPREHENSIVE METABOLIC PANEL - Abnormal; Notable for the following components:   Sodium 125 (*)    Chloride 91 (*)    Glucose, Bld 144 (*)    BUN 7 (*)    Calcium 8.2 (*)    Albumin 3.4 (*)    AST 135 (*)    ALT 84 (*)    All other components within normal limits    EKG None  Radiology No results found.  Procedures Procedures  {Document cardiac monitor, telemetry assessment procedure when appropriate:1}  Medications Ordered in ED Medications  sodium  chloride 0.9 % bolus 500 mL (500 mLs Intravenous New Bag/Given 10/02/21 2222)    ED Course/ Medical Decision Making/ A&P  Patient with very large inguinal hernia and possible DVT right leg with hyponatremia.  I spoke with general surgery Dr. Henreitta Leber and she will see the patient tomorrow for the hernia.  Patient will get an ultrasound of his scrotum and his right lower leg tomorrow                         Medical Decision Making Amount and/or Complexity of Data Reviewed Labs: ordered.  Risk Decision regarding hospitalization.   Swollen leg with possible DVT and hyponatremia along with large inguinal hernia.  He will be admitted to medicine with surgery consult tomorrow  {Document critical care time when appropriate:1} {Document review of labs and clinical decision tools ie heart score, Chads2Vasc2 etc:1}   {Document your independent review of radiology images, and any outside records:1} {Document your discussion with family members, caretakers, and with consultants:1} {Document social determinants of health affecting pt's care:1} {Document your decision making why or why not admission, treatments were needed:1} Final Clinical Impression(s) / ED Diagnoses Final diagnoses:  Leg swelling  Hyponatremia    Rx / DC Orders ED Discharge Orders     None

## 2021-10-03 ENCOUNTER — Encounter (HOSPITAL_COMMUNITY): Payer: Self-pay | Admitting: Internal Medicine

## 2021-10-03 ENCOUNTER — Inpatient Hospital Stay (HOSPITAL_COMMUNITY): Payer: Medicaid Other

## 2021-10-03 DIAGNOSIS — M7989 Other specified soft tissue disorders: Secondary | ICD-10-CM | POA: Diagnosis not present

## 2021-10-03 DIAGNOSIS — F1010 Alcohol abuse, uncomplicated: Secondary | ICD-10-CM

## 2021-10-03 DIAGNOSIS — E1165 Type 2 diabetes mellitus with hyperglycemia: Secondary | ICD-10-CM

## 2021-10-03 DIAGNOSIS — K4030 Unilateral inguinal hernia, with obstruction, without gangrene, not specified as recurrent: Secondary | ICD-10-CM

## 2021-10-03 DIAGNOSIS — E669 Obesity, unspecified: Secondary | ICD-10-CM

## 2021-10-03 DIAGNOSIS — R7401 Elevation of levels of liver transaminase levels: Secondary | ICD-10-CM

## 2021-10-03 DIAGNOSIS — L03115 Cellulitis of right lower limb: Secondary | ICD-10-CM

## 2021-10-03 DIAGNOSIS — E8809 Other disorders of plasma-protein metabolism, not elsewhere classified: Secondary | ICD-10-CM

## 2021-10-03 DIAGNOSIS — E871 Hypo-osmolality and hyponatremia: Secondary | ICD-10-CM

## 2021-10-03 LAB — COMPREHENSIVE METABOLIC PANEL
ALT: 72 U/L — ABNORMAL HIGH (ref 0–44)
AST: 95 U/L — ABNORMAL HIGH (ref 15–41)
Albumin: 3 g/dL — ABNORMAL LOW (ref 3.5–5.0)
Alkaline Phosphatase: 96 U/L (ref 38–126)
Anion gap: 7 (ref 5–15)
BUN: 7 mg/dL — ABNORMAL LOW (ref 8–23)
CO2: 27 mmol/L (ref 22–32)
Calcium: 8.3 mg/dL — ABNORMAL LOW (ref 8.9–10.3)
Chloride: 97 mmol/L — ABNORMAL LOW (ref 98–111)
Creatinine, Ser: 0.9 mg/dL (ref 0.61–1.24)
GFR, Estimated: >60 mL/min (ref >60)
Glucose, Bld: 109 mg/dL — ABNORMAL HIGH (ref 70–99)
Potassium: 4.1 mmol/L (ref 3.5–5.1)
Sodium: 131 mmol/L — ABNORMAL LOW (ref 135–145)
Total Bilirubin: 0.9 mg/dL (ref 0.3–1.2)
Total Protein: 6.1 g/dL — ABNORMAL LOW (ref 6.5–8.1)

## 2021-10-03 LAB — CBC
HCT: 36.2 % — ABNORMAL LOW (ref 39.0–52.0)
Hemoglobin: 12.2 g/dL — ABNORMAL LOW (ref 13.0–17.0)
MCH: 33.2 pg (ref 26.0–34.0)
MCHC: 33.7 g/dL (ref 30.0–36.0)
MCV: 98.6 fL (ref 80.0–100.0)
Platelets: 253 10*3/uL (ref 150–400)
RBC: 3.67 MIL/uL — ABNORMAL LOW (ref 4.22–5.81)
RDW: 13.7 % (ref 11.5–15.5)
WBC: 4.4 10*3/uL (ref 4.0–10.5)
nRBC: 0 % (ref 0.0–0.2)

## 2021-10-03 LAB — GLUCOSE, CAPILLARY
Glucose-Capillary: 150 mg/dL — ABNORMAL HIGH (ref 70–99)
Glucose-Capillary: 122 mg/dL — ABNORMAL HIGH (ref 70–99)
Glucose-Capillary: 126 mg/dL — ABNORMAL HIGH (ref 70–99)
Glucose-Capillary: 122 mg/dL — ABNORMAL HIGH (ref 70–99)
Glucose-Capillary: 128 mg/dL — ABNORMAL HIGH (ref 70–99)

## 2021-10-03 LAB — URINALYSIS, ROUTINE W REFLEX MICROSCOPIC
Bacteria, UA: NONE SEEN
Bilirubin Urine: NEGATIVE
Glucose, UA: NEGATIVE mg/dL
Ketones, ur: NEGATIVE mg/dL
Leukocytes,Ua: NEGATIVE
Nitrite: NEGATIVE
Protein, ur: NEGATIVE mg/dL
Specific Gravity, Urine: 1.013 (ref 1.005–1.030)
pH: 7 (ref 5.0–8.0)

## 2021-10-03 LAB — HEMOGLOBIN A1C
Hgb A1c MFr Bld: 6.1 % — ABNORMAL HIGH (ref 4.8–5.6)
Mean Plasma Glucose: 128.37 mg/dL

## 2021-10-03 LAB — MAGNESIUM: Magnesium: 1.7 mg/dL (ref 1.7–2.4)

## 2021-10-03 LAB — RAPID URINE DRUG SCREEN, HOSP PERFORMED
Amphetamines: NOT DETECTED
Barbiturates: NOT DETECTED
Benzodiazepines: POSITIVE — AB
Cocaine: POSITIVE — AB
Opiates: POSITIVE — AB
Tetrahydrocannabinol: NOT DETECTED

## 2021-10-03 LAB — PHOSPHORUS: Phosphorus: 3.2 mg/dL (ref 2.5–4.6)

## 2021-10-03 MED ORDER — LORAZEPAM 1 MG PO TABS
1.0000 mg | ORAL_TABLET | ORAL | Status: DC | PRN
  Administered 2021-10-04: 1 mg via ORAL
  Filled 2021-10-03 (×2): qty 1

## 2021-10-03 MED ORDER — ASPIRIN 81 MG PO CHEW
81.0000 mg | CHEWABLE_TABLET | Freq: Every day | ORAL | Status: DC
Start: 2021-10-03 — End: 2021-10-03
  Administered 2021-10-03: 81 mg via ORAL
  Filled 2021-10-03: qty 1

## 2021-10-03 MED ORDER — SODIUM CHLORIDE 0.9 % IV SOLN
1.0000 g | INTRAVENOUS | Status: DC
  Administered 2021-10-03 – 2021-10-05 (×3): 1 g via INTRAVENOUS
  Filled 2021-10-03 (×3): qty 10

## 2021-10-03 MED ORDER — TAMSULOSIN HCL 0.4 MG PO CAPS
0.4000 mg | ORAL_CAPSULE | Freq: Every day | ORAL | Status: DC
Start: 2021-10-03 — End: 2021-10-05
  Administered 2021-10-03 – 2021-10-04 (×2): 0.4 mg via ORAL
  Filled 2021-10-03 (×2): qty 1

## 2021-10-03 MED ORDER — FOLIC ACID 1 MG PO TABS
1.0000 mg | ORAL_TABLET | Freq: Every day | ORAL | Status: DC
  Administered 2021-10-03 – 2021-10-05 (×3): 1 mg via ORAL
  Filled 2021-10-03 (×3): qty 1

## 2021-10-03 MED ORDER — CARVEDILOL 3.125 MG PO TABS
6.2500 mg | ORAL_TABLET | Freq: Two times a day (BID) | ORAL | Status: DC
  Administered 2021-10-03 – 2021-10-05 (×5): 6.25 mg via ORAL
  Filled 2021-10-03 (×5): qty 2

## 2021-10-03 MED ORDER — SODIUM CHLORIDE 0.9 % IV SOLN
INTRAVENOUS | Status: AC
Start: 2021-10-03 — End: 2021-10-03

## 2021-10-03 MED ORDER — LORAZEPAM 2 MG/ML IJ SOLN
1.0000 mg | INTRAMUSCULAR | Status: DC | PRN
  Administered 2021-10-03: 2 mg via INTRAVENOUS
  Administered 2021-10-04: 1 mg via INTRAVENOUS
  Filled 2021-10-03 (×3): qty 1

## 2021-10-03 MED ORDER — LISINOPRIL 10 MG PO TABS
10.0000 mg | ORAL_TABLET | Freq: Every day | ORAL | Status: DC
  Administered 2021-10-03 – 2021-10-05 (×3): 10 mg via ORAL
  Filled 2021-10-03 (×3): qty 1

## 2021-10-03 MED ORDER — ENSURE ENLIVE PO LIQD
237.0000 mL | Freq: Two times a day (BID) | ORAL | Status: DC
  Administered 2021-10-03 – 2021-10-05 (×3): 237 mL via ORAL

## 2021-10-03 MED ORDER — ADULT MULTIVITAMIN W/MINERALS CH
1.0000 | ORAL_TABLET | Freq: Every day | ORAL | Status: DC
  Administered 2021-10-03 – 2021-10-05 (×3): 1 via ORAL
  Filled 2021-10-03 (×3): qty 1

## 2021-10-03 MED ORDER — INSULIN ASPART 100 UNIT/ML IJ SOLN
0.0000 [IU] | INTRAMUSCULAR | Status: DC
  Administered 2021-10-03 – 2021-10-04 (×7): 2 [IU] via SUBCUTANEOUS
  Administered 2021-10-04 – 2021-10-05 (×2): 3 [IU] via SUBCUTANEOUS

## 2021-10-03 MED ORDER — THIAMINE HCL 100 MG/ML IJ SOLN
100.0000 mg | Freq: Every day | INTRAMUSCULAR | Status: DC
Start: 2021-10-03 — End: 2021-10-05

## 2021-10-03 MED ORDER — IOHEXOL 300 MG/ML  SOLN
100.0000 mL | Freq: Once | INTRAMUSCULAR | Status: AC | PRN
Start: 2021-10-03 — End: 2021-10-03
  Administered 2021-10-03: 100 mL via INTRAVENOUS

## 2021-10-03 MED ORDER — ASPIRIN 81 MG PO TBEC
81.0000 mg | DELAYED_RELEASE_TABLET | Freq: Every day | ORAL | Status: DC
  Administered 2021-10-03 – 2021-10-05 (×3): 81 mg via ORAL
  Filled 2021-10-03 (×3): qty 1

## 2021-10-03 MED ORDER — THIAMINE MONONITRATE 100 MG PO TABS
100.0000 mg | ORAL_TABLET | Freq: Every day | ORAL | Status: DC
Start: 2021-10-03 — End: 2021-10-05
  Administered 2021-10-03 – 2021-10-05 (×3): 100 mg via ORAL
  Filled 2021-10-03 (×3): qty 1

## 2021-10-03 MED ORDER — DIAZEPAM 5 MG PO TABS
5.0000 mg | ORAL_TABLET | Freq: Three times a day (TID) | ORAL | Status: DC
  Administered 2021-10-03 – 2021-10-05 (×5): 5 mg via ORAL
  Filled 2021-10-03 (×5): qty 1

## 2021-10-03 MED ORDER — SIMVASTATIN 20 MG PO TABS
40.0000 mg | ORAL_TABLET | Freq: Every day | ORAL | Status: DC
  Administered 2021-10-03 – 2021-10-04 (×2): 40 mg via ORAL
  Filled 2021-10-03 (×2): qty 2

## 2021-10-03 NOTE — Progress Notes (Signed)
PROGRESS NOTE     [name], is a 61 y.o. male, DOB - [birth year], LD:[id number]  Admit date - 10/02/2021   Admitting Physician Steven Hoit, DO  Outpatient Primary MD for the patient is Steven Burly, MD  LOS - 1  Chief Complaint  Patient presents with   Hernia        Brief Narrative:   61 y.o. male with medical history significant of CAD, essential hypertension, inguinal hernia and alcohol use and remote history of cocaine abuse presents to the ED with scrotal area discomfort in the setting of large inguinal scrotal hernia that patient has had for about 5 years now   -Assessment and Plan: 1)Large inguinoscrotal Hernia-----CT of the pelvis shows the hernia contains the majority of the sigmoid colon without bowel obstruction and urinary bladder extends into the left inguinal hernia with moderate hydronephrosis and stranding of both kidneys. -Foley placed -General surgery consult appreciated -Possible surgical correction pending preop cardiology clearance  2)H/o CAD--- prior LHC showed small diagonal subtotally occluded, Cx orginating from the RCA -Patient reports intermittent dyspnea on exertion and atypical chest pain usually relieved by nitro -Continue aspirin, simvastatin and Coreg -Echo requested -EKG without ACS type findings -Cardiology consult for possible stress test and preop cardiology evaluation requested -N.p.o. after midnight  3)Alcohol Abuse--patient admits to significant vodka intake on a daily basis -Very high risk for DTs -Benzos per CIWA protocol  4)Moderate Hydronephrosis due to #1 above--- Foley placed on 10/03/2021 -Flomax as ordered  5)Transaminitis--- in setting of alcohol abuse, AST higher than ALT suggestive of alcohol abuse -Consider stopping simvastatin LFTs continue to trend up  6)Right lower extremity cellulitis--venous Dopplers without DVT -Continue IV Rocephin  7)DM2--- A1c 6.1 reflecting excellent diabetic control PTA,  -Hold  metformin Use Novolog/Humalog Sliding scale insulin with Accu-Cheks/Fingersticks as ordered   8)Hyponatremia--- suspect due to dehydration/alcohol abuse -Sodium trending up  9)HTN--above, continue lisinopril and Coreg  Disposition/Need for in-Hospital Stay- patient unable to be discharged at this time due to large inguinal scrotal hernia causing moderate hydronephrosis requiring surgical repair pending cardiology clearance/stress test  Status is: Inpatient   Disposition: The patient is from: Home              Anticipated d/c is to: Home              Anticipated d/c date is: 3 days              Patient currently is not medically stable to d/c. Barriers: Not Clinically Stable-   Code Status :  -  Code Status: Full Code   Family Communication:    NA (patient is alert, awake and coherent)   DVT Prophylaxis  :   - SCDs   SCDs Start: 10/02/21 2312   Lab Results  Component Value Date   PLT 253 10/03/2021    Inpatient Medications  Scheduled Meds:  aspirin  81 mg Oral Daily   carvedilol  6.25 mg Oral BID WC   diazepam  5 mg Oral TID   feeding supplement  237 mL Oral BID BM   folic acid  1 mg Oral Daily   insulin aspart  0-15 Units Subcutaneous Q4H   lisinopril  10 mg Oral Daily   multivitamin with minerals  1 tablet Oral Daily   simvastatin  40 mg Oral QHS   tamsulosin  0.4 mg Oral QPC supper   thiamine  100 mg Oral Daily   Or   thiamine  100 mg  Intravenous Daily   Continuous Infusions:  cefTRIAXone (ROCEPHIN)  IV Stopped (10/03/21 0305)   PRN Meds:.acetaminophen **OR** acetaminophen, LORazepam **OR** LORazepam, ondansetron **OR** ondansetron (ZOFRAN) IV   Anti-infectives (From admission, onward)    Start     Dose/Rate Route Frequency Ordered Stop   10/03/21 0230  cefTRIAXone (ROCEPHIN) 1 g in sodium chloride 0.9 % 100 mL IVPB        1 g 200 mL/hr over 30 Minutes Intravenous Every 24 hours 10/03/21 0131 10/10/21 0229         Subjective: [name] today  has no fevers, no emesis,  No chest pain,   No fever  Or chills   No Nausea, Vomiting or Diarrhea Voiding difficulties - C/o intermittent dyspnea on exertion usually relieved by nitro  Objective: Vitals:   10/02/21 2300 10/03/21 0339 10/03/21 0604 10/03/21 1307  BP: (!) 149/93 139/80 (!) 145/89 (!) 150/99  Pulse: 79 81 82 (!) 104  Resp: 16 18 17 20   Temp: 97.9 F (36.6 C) 98.1 F (36.7 C) 97.8 F (36.6 C) 98.1 F (36.7 C)  TempSrc: Oral Oral Oral   SpO2: 100% 100% 99% 100%  Weight:      Height:        Intake/Output Summary (Last 24 hours) at 10/03/2021 1725 Last data filed at 10/03/2021 1600 Gross per 24 hour  Intake 1390.51 ml  Output --  Net 1390.51 ml   Filed Weights   10/02/21 2100  Weight: 97.5 kg    Physical Exam  Gen:- Awake Alert, in no apparent distress  HEENT:- [place].AT, No sclera icterus Neck-Supple Neck,No JVD,.  Lungs-  CTAB , fair symmetrical air movement CV- S1, S2 normal, regular  Abd-  +ve B.Sounds, Abd Soft, No tenderness,    Extremity/Skin:- No  edema, pedal pulses present  Psych-affect is appropriate, oriented x3 Neuro-no new focal deficits, no tremors GU- Very Large Inguino-scrotal hernia, Foley placed 10/03/21  Data Reviewed: I have personally reviewed following labs and imaging studies  CBC: Recent Labs  Lab 10/02/21 2130 10/03/21 0538  WBC 5.3 4.4  NEUTROABS 3.8  --   HGB 12.7* 12.2*  HCT 37.0* 36.2*  MCV 96.1 98.6  PLT 265 253   Basic Metabolic Panel: Recent Labs  Lab 10/02/21 2130 10/03/21 0538  NA 125* 131*  K 4.3 4.1  CL 91* 97*  CO2 23 27  GLUCOSE 144* 109*  BUN 7* 7*  CREATININE 0.83 0.90  CALCIUM 8.2* 8.3*  MG  --  1.7  PHOS  --  3.2   GFR: Estimated Creatinine Clearance: 99.2 mL/min (by C-G formula based on SCr of 0.9 mg/dL). Liver Function Tests: Recent Labs  Lab 10/02/21 2130 10/03/21 0538  AST 135* 95*  ALT 84* 72*  ALKPHOS 106 96  BILITOT 0.8 0.9  PROT 6.7 6.1*  ALBUMIN 3.4* 3.0*   Cardiac  Enzymes: No results for input(s): "CKTOTAL", "CKMB", "CKMBINDEX", "TROPONINI" in the last 168 hours. BNP (last 3 results) No results for input(s): "PROBNP" in the last 8760 hours. HbA1C: Recent Labs    10/03/21 0538  HGBA1C 6.1*   Sepsis Labs: @LABRCNTIP (procalcitonin:4,lacticidven:4) )No results found for this or any previous visit (from the past 240 hour(s)).    Radiology Studies: 12/03/21 Venous Img Lower Unilateral Right (DVT)  Result Date: 10/03/2021 CLINICAL DATA:  Chronic RIGHT lower extremity edema and redness. History of cellulitis. Rule out DVT. EXAM: RIGHT LOWER EXTREMITY VENOUS DOPPLER ULTRASOUND TECHNIQUE: Gray-scale sonography with compression, as well as color and duplex ultrasound,  were performed to evaluate the deep venous system(s) from the level of the common femoral vein through the popliteal and proximal calf veins. COMPARISON:  None Available. FINDINGS: VENOUS Normal compressibility of the common femoral, superficial femoral, and popliteal veins, as well as the visualized calf veins. Visualized portions of profunda femoral vein and great saphenous vein unremarkable. No filling defects to suggest DVT on grayscale or color Doppler imaging. Doppler waveforms show normal direction of venous flow, normal respiratory plasticity and response to augmentation. Limited views of the contralateral common femoral vein are unremarkable. OTHER Ill-defined fluid/edema within the subcutaneous soft tissues of the RIGHT calf. Limitations: none IMPRESSION: Negative.  No DVT is identified within the RIGHT lower extremity. Electronically Signed   By: Bary Richard M.D.   On: 10/03/2021 16:41   CT PELVIS W CONTRAST  Result Date: 10/03/2021 CLINICAL DATA:  Hernia, complicated, assess contents of hernia for operative planning. EXAM: CT PELVIS WITH CONTRAST TECHNIQUE: Multidetector CT imaging of the pelvis was performed using the standard protocol following the bolus administration of intravenous  contrast. RADIATION DOSE REDUCTION: This exam was performed according to the departmental dose-optimization program which includes automated exposure control, adjustment of the mA and/or kV according to patient size and/or use of iterative reconstruction technique. CONTRAST:  OMNIPAQUE IOHEXOL 300 MG/ML  SOLN COMPARISON:  Report of CT abdomen and pelvis 10/21/2018 at [facility] [place] FINDINGS: Urinary Tract: The ureters are dilated bilaterally extending into a left inguinal hernia. Majority of the urinary bladder extends into the hernia. Ureterovesical junction is at the upper extent of the hernia. Stranding is present about both kidneys which are partially imaged. Bowel: Most of the sigmoid colon extends into the left inguinal hernia without obstruction. Diverticular changes are present in the colon without focal inflammation. Small bowel is unremarkable. No small bowel loops enter the hernia. Vascular/Lymphatic: Atherosclerotic calcifications present the aorta branch vessels. No aneurysm present. Reproductive: Testicles are within normal limits by CT criteria. There is some thickening about the left hemiscrotum associated with the hernia. Other: Paraumbilical hernia contains fat without bowel. The opening is 14 mm. No significant free fluid or free air is present. Musculoskeletal: Bony pelvis is within normal limits. 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 Left hip is within normal limits. IMPRESSION: 1. Large left inguinal hernia contains majority of the sigmoid colon without obstruction. 2. The urinary bladder extends into the left inguinal hernia with partial obstruction of the ureters which are dilated to the level of the kidneys with moderate hydronephrosis and stranding about both kidneys. 3. Colonic diverticulosis without diverticulitis. 4. 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 5. Aortic Atherosclerosis (ICD10-I70.0). Electronically Signed   By: Marin Roberts M.D.   On: 10/03/2021 13:12   US SCROTUM  Result Date: 10/03/2021 CLINICAL DATA:  Scrotal edema for 5 years.  Hernia EXAM: ULTRASOUND OF SCROTUM TECHNIQUE: Complete ultrasound examination of the testicles, epididymis, and other scrotal structures was performed. COMPARISON:  None Available. FINDINGS: The bilateral testicles were unable to be visualized secondary to a very large left-sided bowel containing hernia extending into the scrotum. Diffuse scrotal wall edema. IMPRESSION: Nondiagnostic scrotal ultrasound as testicles were unable to be visualized secondary to patient's large inguinal hernia. Electronically Signed   By: Duanne Guess D.O.   On: 10/03/2021 10:41     Scheduled Meds:  aspirin  81 mg Oral  Daily   carvedilol  6.25 mg Oral BID WC   diazepam  5 mg Oral TID   feeding supplement  237 mL Oral BID BM   folic acid  1 mg Oral Daily   insulin aspart  0-15 Units Subcutaneous Q4H   lisinopril  10 mg Oral Daily   multivitamin with minerals  1 tablet Oral Daily   simvastatin  40 mg Oral QHS   tamsulosin  0.4 mg Oral QPC supper   thiamine  100 mg Oral Daily   Or   thiamine  100 mg Intravenous Daily   Continuous Infusions:  cefTRIAXone (ROCEPHIN)  IV Stopped (10/03/21 0305)    LOS: 1 day   Roxan Hockey M.D on 10/03/2021 at 5:25 PM  Go to [email or web address] - for contact info  [facility] - Office  [phone]  If 7PM-7AM, please contact night-coverage [email or web address] 10/03/2021, 5:25 PM

## 2021-10-03 NOTE — Progress Notes (Signed)
[facility]  Hernia with sigmoid colon and bladder. Hydronephrosis resulting.  Would place foley for decompression. Would get cards to evaluate foe risk stratification.  Awaiting dvt study.  Algis Greenhouse, MD

## 2021-10-03 NOTE — Consult Note (Signed)
[facility] Consult  Reason for Consult: Left sided inguinal hernia  Referring Physician: Dr. Estell Harpin, Dr. Mariea Clonts   Chief Complaint   Hernia     HPI: [name] is a 61 y.o. male with a large inguinal hernia on the left that has been there for 5+ years. Dr. Olena Leatherwood, Myra Gianotti, MD has told him to get this fixed but the patient says he was scared and did not like doctors. He says over the last 6 months it is getting worse. He says that he has to use a cup to push into the scrotum and get himself to urinate. He says he use to get it back into his abdomen but he has not been able to in several months. The patient denies obstructive symptoms. He says he takes "4 pills a week" to help push it back into place. He reports having some CP and taking nitroglycerin weekly. He has no recent EKG or ECHO in our system. He does not see cardiology.   He technically presented with right sided leg swelling. He says the leg swelling is better. He is being worked up for DVT.   Past Medical History:  Diagnosis Date   Cocaine abuse [facility])    Remote history   Coronary artery disease    Small diagonal subtotally occluded. Cx orginating from the RCA.   Diabetes mellitus    Gout    Hyperlipidemia    Hypertension    Psoriasis     Past Surgical History:  Procedure Laterality Date   none      History reviewed. No pertinent family history.  Social History   Tobacco Use   Smoking status: Former  Substance Use Topics   Alcohol use: Yes   Drug use: Yes    Types: Cocaine    Medications: I have reviewed the patient's current medications. Prior to Admission:  Medications Prior to Admission  Medication Sig Dispense Refill Last Dose   allopurinol (ZYLOPRIM) 100 MG tablet Take 100 mg by mouth daily.        aspirin 81 MG tablet Take 81 mg by mouth daily.      calcipotriene-betamethasone (TACLONEX) ointment Apply topically daily. ONE TIME ONLY  PLEASE OBTAIN FURTHER REFILLS FROM PCP 60 g  0    carvedilol (COREG) 6.25 MG tablet Take 1 tablet (6.25 mg total) by mouth 2 (two) times daily with a meal. 180 tablet 3    isosorbide mononitrate (IMDUR) 60 MG 24 hr tablet TAKE ONE TABLET BY MOUTH ONCE DAILY 30 tablet 11    lisinopril (PRINIVIL,ZESTRIL) 10 MG tablet Take 1 tablet (10 mg total) by mouth daily. 90 tablet 3    metFORMIN (GLUMETZA) 500 MG (MOD) 24 hr tablet Take 500 mg by mouth daily with breakfast.        nitroGLYCERIN (NITROSTAT) 0.4 MG SL tablet Place 1 tablet (0.4 mg total) under the tongue every 5 (five) minutes as needed. 25 tablet 3    simvastatin (ZOCOR) 40 MG tablet TAKE ONE TABLET BY MOUTH AT BEDTIME 30 tablet 11    traMADol (ULTRAM) 50 MG tablet Take 1 tablet (50 mg total) by mouth every 6 (six) hours as needed for pain. 90 tablet 0    Scheduled:  aspirin  81 mg Oral Daily   carvedilol  6.25 mg Oral BID WC   feeding supplement  237 mL Oral BID BM   insulin aspart  0-15 Units Subcutaneous Q4H   lisinopril  10 mg Oral Daily   simvastatin  40 mg Oral QHS   Continuous:  sodium chloride 100 mL/hr at 10/03/21 0523   cefTRIAXone (ROCEPHIN)  IV Stopped (10/03/21 0305)   XBD:[id number] **OR** acetaminophen, iohexol, ondansetron **OR** ondansetron (ZOFRAN) IV  No Known Allergies   ROS:  A comprehensive review of systems was negative except for: Gastrointestinal: positive for groin pain, left side Musculoskeletal: positive for right sided leg swelling  Blood pressure (!) 145/89, pulse 82, temperature 97.8 F (36.6 C), temperature source Oral, resp. rate 17, height 5' 10.5" (1.791 m), weight 97.5 kg, SpO2 99 %. Physical Exam Vitals reviewed.  Constitutional:      Appearance: Normal appearance.  HENT:     Head: Normocephalic.     Nose: Nose normal.  Eyes:     Pupils: Pupils are equal, round, and reactive to light.  Cardiovascular:     Rate and Rhythm: Normal rate.  Pulmonary:     Effort: Pulmonary effort is normal.  Abdominal:     General: There is no  distension.     Palpations: Abdomen is soft.     Tenderness: There is no abdominal tenderness.     Hernia: A hernia is present. Hernia is present in the left inguinal area.     Comments: Incarcerated left sided hernia, tender  Musculoskeletal:        General: Swelling present.     Right lower leg: Edema present.     Comments: R>L swelling  Skin:    General: Skin is warm.  Neurological:     General: No focal deficit present.     Mental Status: He is alert.  Psychiatric:        Mood and Affect: Mood normal.        Behavior: Behavior normal.        Thought Content: Thought content normal.        Judgment: Judgment normal.     Results: Results for orders placed or performed during the hospital encounter of 10/02/21 (from the past 48 hour(s))  CBC with Differential     Status: Abnormal   Collection Time: 10/02/21  9:30 PM  Result Value Ref Range   WBC 5.3 4.0 - 10.5 K/uL   RBC 3.85 (L) 4.22 - 5.81 MIL/uL   Hemoglobin 12.7 (L) 13.0 - 17.0 g/dL   HCT 96.2 (L) 22.9 - 79.8 %   MCV 96.1 80.0 - 100.0 fL   MCH 33.0 26.0 - 34.0 pg   MCHC 34.3 30.0 - 36.0 g/dL   RDW 92.1 19.4 - 17.4 %   Platelets 265 150 - 400 K/uL   nRBC 0.0 0.0 - 0.2 %   Neutrophils Relative % 70 %   Neutro Abs 3.8 1.7 - 7.7 K/uL   Lymphocytes Relative 21 %   Lymphs Abs 1.1 0.7 - 4.0 K/uL   Monocytes Relative 7 %   Monocytes Absolute 0.4 0.1 - 1.0 K/uL   Eosinophils Relative 1 %   Eosinophils Absolute 0.0 0.0 - 0.5 K/uL   Basophils Relative 0 %   Basophils Absolute 0.0 0.0 - 0.1 K/uL   Immature Granulocytes 1 %   Abs Immature Granulocytes 0.03 0.00 - 0.07 K/uL    Comment: Performed at [facility], [address], [place], [place] [address]  Comprehensive metabolic panel     Status: Abnormal   Collection Time: 10/02/21  9:30 PM  Result Value Ref Range   Sodium 125 (L) 135 - 145 mmol/L   Potassium 4.3 3.5 - 5.1 mmol/L   Chloride 91 (  L) 98 - 111 mmol/L   CO2 23 22 - 32 mmol/L   Glucose, Bld 144 (H) 70 - 99  mg/dL    Comment: Glucose reference range applies only to samples taken after fasting for at least 8 hours.   BUN 7 (L) 8 - 23 mg/dL   Creatinine, Ser 2.37 0.61 - 1.24 mg/dL   Calcium 8.2 (L) 8.9 - 10.3 mg/dL   Total Protein 6.7 6.5 - 8.1 g/dL   Albumin 3.4 (L) 3.5 - 5.0 g/dL   AST 628 (H) 15 - 41 U/L   ALT 84 (H) 0 - 44 U/L   Alkaline Phosphatase 106 38 - 126 U/L   Total Bilirubin 0.8 0.3 - 1.2 mg/dL   GFR, Estimated >31 >51 mL/min    Comment: (NOTE) Calculated using the CKD-EPI Creatinine Equation (2021)    Anion gap 11 5 - 15    Comment: Performed at [facility], [address] [place], [place] [address]  Glucose, capillary     Status: Abnormal   Collection Time: 10/03/21  3:41 AM  Result Value Ref Range   Glucose-Capillary 150 (H) 70 - 99 mg/dL    Comment: Glucose reference range applies only to samples taken after fasting for at least 8 hours.  Comprehensive metabolic panel     Status: Abnormal   Collection Time: 10/03/21  5:38 AM  Result Value Ref Range   Sodium 131 (L) 135 - 145 mmol/L   Potassium 4.1 3.5 - 5.1 mmol/L   Chloride 97 (L) 98 - 111 mmol/L   CO2 27 22 - 32 mmol/L   Glucose, Bld 109 (H) 70 - 99 mg/dL    Comment: Glucose reference range applies only to samples taken after fasting for at least 8 hours.   BUN 7 (L) 8 - 23 mg/dL   Creatinine, Ser 7.37 0.61 - 1.24 mg/dL   Calcium 8.3 (L) 8.9 - 10.3 mg/dL   Total Protein 6.1 (L) 6.5 - 8.1 g/dL   Albumin 3.0 (L) 3.5 - 5.0 g/dL   AST 95 (H) 15 - 41 U/L   ALT 72 (H) 0 - 44 U/L   Alkaline Phosphatase 96 38 - 126 U/L   Total Bilirubin 0.9 0.3 - 1.2 mg/dL   GFR, Estimated >10 >62 mL/min    Comment: (NOTE) Calculated using the CKD-EPI Creatinine Equation (2021)    Anion gap 7 5 - 15    Comment: Performed at [facility], [address], [place], [place] [address]  CBC     Status: Abnormal   Collection Time: 10/03/21  5:38 AM  Result Value Ref Range   WBC 4.4 4.0 - 10.5 K/uL   RBC 3.67 (L) 4.22 - 5.81 MIL/uL    Hemoglobin 12.2 (L) 13.0 - 17.0 g/dL   HCT 46.2 (L) 70.3 - 50.0 %   MCV 98.6 80.0 - 100.0 fL   MCH 33.2 26.0 - 34.0 pg   MCHC 33.7 30.0 - 36.0 g/dL   RDW 93.8 18.2 - 99.3 %   Platelets 253 150 - 400 K/uL   nRBC 0.0 0.0 - 0.2 %    Comment: Performed at [facility], [address] [place], [place] [address]  Magnesium     Status: None   Collection Time: 10/03/21  5:38 AM  Result Value Ref Range   Magnesium 1.7 1.7 - 2.4 mg/dL    Comment: Performed at [facility], [address], [place], [place] [address]  Phosphorus     Status: None  Collection Time: 10/03/21  5:38 AM  Result Value Ref Range   Phosphorus 3.2 2.5 - 4.6 mg/dL    Comment: Performed at [facility], [address], [place], [place] [address]  Glucose, capillary     Status: Abnormal   Collection Time: 10/03/21  7:20 AM  Result Value Ref Range   Glucose-Capillary 122 (H) 70 - 99 mg/dL    Comment: Glucose reference range applies only to samples taken after fasting for at least 8 hours.    US SCROTUM  Result Date: 10/03/2021 CLINICAL DATA:  Scrotal edema for 5 years.  Hernia EXAM: ULTRASOUND OF SCROTUM TECHNIQUE: Complete ultrasound examination of the testicles, epididymis, and other scrotal structures was performed. COMPARISON:  None Available. FINDINGS: The bilateral testicles were unable to be visualized secondary to a very large left-sided bowel containing hernia extending into the scrotum. Diffuse scrotal wall edema. IMPRESSION: Nondiagnostic scrotal ultrasound as testicles were unable to be visualized secondary to patient's large inguinal hernia. Electronically Signed   By: Duanne Guess D.O.   On: 10/03/2021 10:41     Assessment & Plan:  Barclay Lennox is a 61 y.o. male with a large incarcerated hernia on the left. He says he cannot urinate due to the hernia. He is being worked up for a DVT. Discussed that he will need cardiac risk stratification given his CP and nitroglycerin use. Discussed that the DVT could  delay the surgery as this has been present for a long time and incarcerated for a long time per the patient. He has no obstructive symptoms.   CT pelvis to assess the extent of bowel in hernia and possible bladder?  DVT workup pending  EKG ordered, will need cardiac risk  Will determine how much of this needs to be done as outpatient versus inpatient after the test results return   All questions were answered to the satisfaction of the patient.     Lucretia Roers 10/03/2021, 11:08 AM

## 2021-10-04 ENCOUNTER — Encounter (HOSPITAL_COMMUNITY): Payer: Self-pay | Admitting: Internal Medicine

## 2021-10-04 ENCOUNTER — Encounter (HOSPITAL_COMMUNITY): Payer: Self-pay | Admitting: Anesthesiology

## 2021-10-04 ENCOUNTER — Inpatient Hospital Stay (HOSPITAL_COMMUNITY): Payer: Medicaid Other

## 2021-10-04 ENCOUNTER — Other Ambulatory Visit (HOSPITAL_COMMUNITY): Payer: Self-pay | Admitting: *Deleted

## 2021-10-04 DIAGNOSIS — R0609 Other forms of dyspnea: Secondary | ICD-10-CM

## 2021-10-04 DIAGNOSIS — Z01810 Encounter for preprocedural cardiovascular examination: Secondary | ICD-10-CM

## 2021-10-04 DIAGNOSIS — M7989 Other specified soft tissue disorders: Secondary | ICD-10-CM | POA: Diagnosis not present

## 2021-10-04 DIAGNOSIS — I25119 Atherosclerotic heart disease of native coronary artery with unspecified angina pectoris: Secondary | ICD-10-CM

## 2021-10-04 LAB — ECHOCARDIOGRAM COMPLETE
Area-P 1/2: 2.81 cm2
Height: 70.5 in
S' Lateral: 3.1 cm
Weight: 3440 oz

## 2021-10-04 LAB — GLUCOSE, CAPILLARY
Glucose-Capillary: 112 mg/dL — ABNORMAL HIGH (ref 70–99)
Glucose-Capillary: 129 mg/dL — ABNORMAL HIGH (ref 70–99)
Glucose-Capillary: 128 mg/dL — ABNORMAL HIGH (ref 70–99)
Glucose-Capillary: 123 mg/dL — ABNORMAL HIGH (ref 70–99)
Glucose-Capillary: 114 mg/dL — ABNORMAL HIGH (ref 70–99)
Glucose-Capillary: 168 mg/dL — ABNORMAL HIGH (ref 70–99)

## 2021-10-04 LAB — SURGICAL PCR SCREEN
MRSA, PCR: NEGATIVE
Staphylococcus aureus: POSITIVE — AB

## 2021-10-04 LAB — HIV ANTIBODY (ROUTINE TESTING W REFLEX): HIV Screen 4th Generation wRfx: NONREACTIVE

## 2021-10-04 MED ORDER — MUPIROCIN 2 % EX OINT
1.0000 | TOPICAL_OINTMENT | Freq: Two times a day (BID) | CUTANEOUS | Status: DC
  Administered 2021-10-04 – 2021-10-05 (×3): 1 via NASAL
  Filled 2021-10-04: qty 22

## 2021-10-04 NOTE — TOC Progression Note (Signed)
  Transition of Care [facility]) Screening Note   Patient Details  Name: [name] Date of Birth: 02-Apr-1960   Transition of Care [facility]) CM/SW Contact:    Leitha Bleak, RN Phone Number: 10/04/2021, 3:21 PM  Getting Echo   Transition of Care Department [facility]) has reviewed patient and no TOC needs have been identified at this time. We will continue to monitor patient advancement through interdisciplinary progression rounds. If new patient transition needs arise, please place a TOC consult.     Expected Discharge Plan: Home/Self Care Barriers to Discharge: Continued Medical Work up  Expected Discharge Plan and Services Expected Discharge Plan: Home/Self Care

## 2021-10-04 NOTE — Progress Notes (Signed)
*  PRELIMINARY RESULTS* Echocardiogram 2D Echocardiogram has been performed.  Stacey Drain 10/04/2021, 2:53 PM

## 2021-10-04 NOTE — Progress Notes (Signed)
[facility]  Patient unable to get surgery at [facility] or [facility] due to cardiac risk. Recommend that he be transferred to tertiary care for repair. Discussed with Urology given the bladder being partially obstructed causing retention and hydronephrosis. He has normal Cr but given the findings would recommend he keep the foley in place for decompression. This is all going to get worse and he needs a more urgent repair but not emergent. He is not obstructed with regards to his bowels.  Steven Greenhouse, MD [facility] [address] [place], [place] [address] [phone] (office)

## 2021-10-04 NOTE — Progress Notes (Signed)
PROGRESS NOTE     [name], is a 61 y.o. male, DOB - 24-Mar-1960, ZOX:[id number]  Admit date - 10/02/2021   Admitting Physician Frankey Shown, DO  Outpatient Primary MD for the patient is Toma Deiters, MD  LOS - 2  Chief Complaint  Patient presents with   Hernia        Brief Narrative:   61 y.o. male with medical history significant of CAD, essential hypertension, inguinal hernia and alcohol use and remote history of cocaine abuse presents to the ED with scrotal area discomfort in the setting of large inguinal scrotal hernia that patient has had for about 5 years now   -Assessment and Plan: 1)Large inguinoscrotal Hernia-----CT of the pelvis shows the hernia contains the majority of the sigmoid colon without bowel obstruction and urinary bladder extends into the left inguinal hernia with moderate hydronephrosis and stranding of both kidneys. -Foley placed -General surgery consult appreciated -Cardiology preop clearance noted patient is too high risk to have surgery at [facility] or [facility] he needs to be transferred to tertiary center such as [facility] or [facility] -Patient is surgical correction in the near future but not emergently as he does not have bowel obstruction at this time -  2)H/o CAD--- prior LHC showed small diagonal subtotally occluded, Cx orginating from the RCA -Patient reports intermittent dyspnea on exertion and atypical chest pain usually relieved by nitro -Continue aspirin, simvastatin, lisinopril and Coreg -Echo from 10/04/2021 with EF of 60 to 65%, and grade 1 diastolic dysfunction -EKG without ACS type findings -Cardiology consult appreciated-  Cardiac catheterization in 2010 showed an occluded small diagonal that was felt to be infarct-related vessel in the setting of cocaine use and manage medically, also 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  There was a 60-70%  mid RCA stenosis and moderately high-grade stenosis in the ramus intermedius. -Suspect he has had progression in CAD over the years and with continued cocaine abuse increased risk for vasculopathy as well.  3)Alcohol Abuse--patient admits to significant vodka intake on a daily basis -Very high risk for DTs -Benzos per CIWA protocol  4)Moderate Hydronephrosis due to #1 above--- Foley placed on 10/03/2021 -Flomax as ordered  5)Transaminitis--- in setting of alcohol abuse, AST higher than ALT suggestive of alcohol abuse -Consider stopping simvastatin LFTs continue to trend up  6)Right lower extremity cellulitis--venous Dopplers without DVT -Continue IV Rocephin  7)DM2--- A1c 6.1 reflecting excellent diabetic control PTA,  -Hold metformin Use Novolog/Humalog Sliding scale insulin with Accu-Cheks/Fingersticks as ordered   8)Hyponatremia--- suspect due to dehydration/alcohol abuse -Sodium trending up  9)HTN--above, continue lisinopril and Coreg  10) cocaine abuse--- positive UDS makes it difficult to do stress test at this time  Disposition/Need for in-Hospital Stay- patient unable to be discharged at this time due to large inguinal scrotal hernia causing moderate hydronephrosis requiring surgical repair pending cardiology clearance/stress test -  Status is: Inpatient   Disposition: The patient is from: Home              Anticipated d/c is to: Home              Anticipated d/c date is: 3 days              Patient currently is not medically stable to d/c. Barriers: Not Clinically Stable-   Code Status :  -  Code Status: Full Code   Family Communication:  NA (patient is alert, awake and coherent)   DVT Prophylaxis  :   - SCDs   SCDs Start: 10/02/21 2312   Lab Results  Component Value Date   PLT 253 10/03/2021    Inpatient Medications  Scheduled Meds:  aspirin EC  81 mg Oral Q breakfast   carvedilol  6.25 mg Oral BID WC   diazepam  5 mg Oral TID   feeding supplement   237 mL Oral BID BM   folic acid  1 mg Oral Daily   insulin aspart  0-15 Units Subcutaneous Q4H   lisinopril  10 mg Oral Daily   multivitamin with minerals  1 tablet Oral Daily   mupirocin ointment  1 Application Nasal BID   simvastatin  40 mg Oral QHS   tamsulosin  0.4 mg Oral QPC supper   thiamine  100 mg Oral Daily   Or   thiamine  100 mg Intravenous Daily   Continuous Infusions:  cefTRIAXone (ROCEPHIN)  IV 1 g (10/04/21 0229)   PRN Meds:.acetaminophen **OR** acetaminophen, LORazepam **OR** LORazepam, ondansetron **OR** ondansetron (ZOFRAN) IV   Anti-infectives (From admission, onward)    Start     Dose/Rate Route Frequency Ordered Stop   10/03/21 0230  cefTRIAXone (ROCEPHIN) 1 g in sodium chloride 0.9 % 100 mL IVPB        1 g 200 mL/hr over 30 Minutes Intravenous Every 24 hours 10/03/21 0131 10/10/21 0229         Subjective: [name] today has no fevers, no emesis,  No chest pain,   No fever  Or chills   No Nausea, Vomiting or Diarrhea Voiding difficulties = Patient is threatening to leave AMA... Patient's brother-in-law was able to convince him to stay  Objective: Vitals:   10/03/21 1307 10/03/21 2134 10/04/21 0451 10/04/21 1452  BP: (!) 150/99 115/60 (!) 144/97 (!) 156/89  Pulse: (!) 104 99 (!) 110 93  Resp: 20 20 18 18   Temp: 98.1 F (36.7 C) 97.8 F (36.6 C) 98 F (36.7 C) 98.3 F (36.8 C)  TempSrc:      SpO2: 100% 96% 97% 98%  Weight:      Height:        Intake/Output Summary (Last 24 hours) at 10/04/2021 2001 Last data filed at 10/04/2021 1853 Gross per 24 hour  Intake 360 ml  Output 1400 ml  Net -1040 ml   Filed Weights   10/02/21 2100  Weight: 97.5 kg    Physical Exam  Gen:- Awake Alert, in no apparent distress  HEENT:- [place].AT, No sclera icterus Neck-Supple Neck,No JVD,.  Lungs-  CTAB , fair symmetrical air movement CV- S1, S2 normal, regular  Abd-  +ve B.Sounds, Abd Soft, No tenderness,    Extremity/Skin:- No  edema, pedal  pulses present  Psych-affect is appropriate, oriented x3 Neuro-no new focal deficits, no tremors GU- Very Large Inguino-scrotal hernia, Foley placed 10/03/21  Data Reviewed: I have personally reviewed following labs and imaging studies  CBC: Recent Labs  Lab 10/02/21 2130 10/03/21 0538  WBC 5.3 4.4  NEUTROABS 3.8  --   HGB 12.7* 12.2*  HCT 37.0* 36.2*  MCV 96.1 98.6  PLT 265 253   Basic Metabolic Panel: Recent Labs  Lab 10/02/21 2130 10/03/21 0538  NA 125* 131*  K 4.3 4.1  CL 91* 97*  CO2 23 27  GLUCOSE 144* 109*  BUN 7* 7*  CREATININE 0.83 0.90  CALCIUM 8.2* 8.3*  MG  --  1.7  PHOS  --  3.2   GFR: Estimated Creatinine Clearance: 99.2 mL/min (by C-G formula based on SCr of 0.9 mg/dL). Liver Function Tests: Recent Labs  Lab 10/02/21 2130 10/03/21 0538  AST 135* 95*  ALT 84* 72*  ALKPHOS 106 96  BILITOT 0.8 0.9  PROT 6.7 6.1*  ALBUMIN 3.4* 3.0*   Recent Labs    10/03/21 0538  HGBA1C 6.1*   Recent Results (from the past 240 hour(s))  Surgical PCR screen     Status: Abnormal   Collection Time: 10/04/21  5:36 AM   Specimen: Nasal Mucosa; Nasal Swab  Result Value Ref Range Status   MRSA, PCR NEGATIVE NEGATIVE Final   Staphylococcus aureus POSITIVE (A) NEGATIVE Final    Comment: RESULT CALLED TO, READ BACK BY AND VERIFIED WITH:  A. RAY @ [phone] BY STEPHTR 10/04/21 (NOTE) The Xpert SA Assay (FDA approved for NASAL specimens in patients 57 years of age and older), is one component of a comprehensive surveillance program. It is not intended to diagnose infection nor to guide or monitor treatment. Performed at [facility], [address], [place], [place] [address]      Radiology Studies: ECHOCARDIOGRAM COMPLETE  Result Date: 10/04/2021    ECHOCARDIOGRAM REPORT   Patient Name:   DIESEL Delaughter Date of Exam: 10/04/2021 Medical Rec #:  [id number]        Height:       70.5 in Accession #:    [id number]       Weight:       215.0 lb Date of Birth:  29-Dec-1960         BSA:          2.164 m Patient Age:    61 years         BP:           144/97 mmHg Patient Gender: M                HR:           95 bpm. [facility] Procedure: 2D Echo, Cardiac Doppler and Color Doppler Indications:    Dyspnea R06.00  History:        Patient has no prior history of Echocardiogram examinations.                 CAD; Risk Factors:Hypertension, Diabetes and Dyslipidemia.                 Alcohol abuse, SUBSTANCE ABUSE.  Sonographer:    Celesta Gentile RCS Referring Phys: [phone] Audley Hinojos IMPRESSIONS  1. Left ventricular ejection fraction, by estimation, is 60 to 65%. The left ventricle has normal function. The left ventricle has no regional wall motion abnormalities. There is mild concentric left ventricular hypertrophy. Left ventricular diastolic parameters are consistent with Grade I diastolic dysfunction (impaired relaxation).  2. Right ventricular systolic function is normal. The right ventricular size is normal. Tricuspid regurgitation signal is inadequate for assessing PA pressure.  3. Left atrial size was moderately dilated.  4. The mitral valve is grossly normal. Mild mitral valve regurgitation.  5. The aortic valve was not well visualized. Aortic valve regurgitation is mild.  6. The inferior vena cava is normal in size with greater than 50% respiratory variability, suggesting right atrial pressure of 3 mmHg.  7. Prominent epicardial fat pad and trivial anterior pericardial effusion. Comparison(s): No prior Echocardiogram. FINDINGS  Left Ventricle: Left ventricular ejection fraction, by estimation, is 60 to 65%. The left ventricle has normal  function. The left ventricle has no regional wall motion abnormalities. The left ventricular internal cavity size was normal in size. There is  mild concentric left ventricular hypertrophy. Left ventricular diastolic parameters are consistent with Grade I diastolic dysfunction (impaired relaxation). Right Ventricle: The right  ventricular size is normal. No increase in right ventricular wall thickness. Right ventricular systolic function is normal. Tricuspid regurgitation signal is inadequate for assessing PA pressure. Left Atrium: Left atrial size was moderately dilated. Right Atrium: Right atrial size was normal in size. Pericardium: Trivial pericardial effusion is present. The pericardial effusion is anterior to the right ventricle. Presence of epicardial fat Hayes. Mitral Valve: The mitral valve is grossly normal. Mild mitral valve regurgitation. Tricuspid Valve: The tricuspid valve is grossly normal. Tricuspid valve regurgitation is trivial. Aortic Valve: The aortic valve was not well visualized. There is mild aortic valve annular calcification. Aortic valve regurgitation is mild. Pulmonic Valve: The pulmonic valve was grossly normal. Pulmonic valve regurgitation is trivial. Aorta: The aortic root is normal in size and structure. Venous: The inferior vena cava is normal in size with greater than 50% respiratory variability, suggesting right atrial pressure of 3 mmHg. IAS/Shunts: The interatrial septum appears to be lipomatous. No atrial level shunt detected by color flow Doppler.  LEFT VENTRICLE PLAX 2D LVIDd:         4.50 cm LVIDs:         3.10 cm LV PW:         1.20 cm LV IVS:        1.30 cm LVOT diam:     2.10 cm LV SV:         84 LV SV Index:   39 LVOT Area:     3.46 cm  RIGHT VENTRICLE RV S prime:     14.10 cm/s TAPSE (M-mode): 2.1 cm LEFT ATRIUM              Index        RIGHT ATRIUM           Index LA diam:        4.20 cm  1.94 cm/m   RA Area:     15.50 cm LA Vol (A2C):   89.8 ml  41.50 ml/m  RA Volume:   38.00 ml  17.56 ml/m LA Vol (A4C):   111.0 ml 51.30 ml/m LA Biplane Vol: 102.0 ml 47.14 ml/m  AORTIC VALVE LVOT Vmax:   119.00 cm/s LVOT Vmean:  82.600 cm/s LVOT VTI:    0.243 m  AORTA Ao Root diam: 3.40 cm MITRAL VALVE MV Area (PHT): 2.81 cm     SHUNTS MV Decel Time: 270 msec     Systemic VTI:  0.24 m MV E velocity:  73.00 cm/s   Systemic Diam: 2.10 cm MV A velocity: 110.50 cm/s MV E/A ratio:  0.66 Nona DellSamuel Mcdowell MD Electronically signed by Nona DellSamuel Mcdowell MD Signature Date/Time: 10/04/2021/4:21:42 PM    Final    US Venous Img Lower Unilateral Right (DVT)  Result Date: 10/03/2021 CLINICAL DATA:  Chronic RIGHT lower extremity edema and redness. History of cellulitis. Rule out DVT. EXAM: RIGHT LOWER EXTREMITY VENOUS DOPPLER ULTRASOUND TECHNIQUE: Gray-scale sonography with compression, as well as color and duplex ultrasound, were performed to evaluate the deep venous system(s) from the level of the common femoral vein through the popliteal and proximal calf veins. COMPARISON:  None Available. FINDINGS: VENOUS Normal compressibility of the common femoral, superficial femoral, and popliteal veins, as well as the visualized calf  veins. Visualized portions of profunda femoral vein and great saphenous vein unremarkable. No filling defects to suggest DVT on grayscale or color Doppler imaging. Doppler waveforms show normal direction of venous flow, normal respiratory plasticity and response to augmentation. Limited views of the contralateral common femoral vein are unremarkable. OTHER Ill-defined fluid/edema within the subcutaneous soft tissues of the RIGHT calf. Limitations: none IMPRESSION: Negative.  No DVT is identified within the RIGHT lower extremity. Electronically Signed   By: Bary Richard M.D.   On: 10/03/2021 16:41   CT PELVIS W CONTRAST  Result Date: 10/03/2021 CLINICAL DATA:  Hernia, complicated, assess contents of hernia for operative planning. EXAM: CT PELVIS WITH CONTRAST TECHNIQUE: Multidetector CT imaging of the pelvis was performed using the standard protocol following the bolus administration of intravenous contrast. RADIATION DOSE REDUCTION: This exam was performed according to the departmental dose-optimization program which includes automated exposure control, adjustment of the mA and/or kV according to  patient size and/or use of iterative reconstruction technique. CONTRAST:  OMNIPAQUE IOHEXOL 300 MG/ML  SOLN COMPARISON:  Report of CT abdomen and pelvis 10/21/2018 at [facility] [place] FINDINGS: Urinary Tract: The ureters are dilated bilaterally extending into a left inguinal hernia. Majority of the urinary bladder extends into the hernia. Ureterovesical junction is at the upper extent of the hernia. Stranding is present about both kidneys which are partially imaged. Bowel: Most of the sigmoid colon extends into the left inguinal hernia without obstruction. Diverticular changes are present in the colon without focal inflammation. Small bowel is unremarkable. No small bowel loops enter the hernia. Vascular/Lymphatic: Atherosclerotic calcifications present the aorta branch vessels. No aneurysm present. Reproductive: Testicles are within normal limits by CT criteria. There is some thickening about the left hemiscrotum associated with the hernia. Other: Paraumbilical hernia contains fat without bowel. The opening is 14 mm. No significant free fluid or free air is present. Musculoskeletal: Bony pelvis is within normal limits. 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 Left hip is within normal limits. IMPRESSION: 1. Large left inguinal hernia contains majority of the sigmoid colon without obstruction. 2. The urinary bladder extends into the left inguinal hernia with partial obstruction of the ureters which are dilated to the level of the kidneys with moderate hydronephrosis and stranding about both kidneys. 3. Colonic diverticulosis without diverticulitis. 4. 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 5. Aortic Atherosclerosis (ICD10-I70.0). Electronically Signed   By: Marin Roberts M.D.   On: 10/03/2021 13:12   US SCROTUM  Result Date:  10/03/2021 CLINICAL DATA:  Scrotal edema for 5 years.  Hernia EXAM: ULTRASOUND OF SCROTUM TECHNIQUE: Complete ultrasound examination of the testicles, epididymis, and other scrotal structures was performed. COMPARISON:  None Available. FINDINGS: The bilateral testicles were unable to be visualized secondary to a very large left-sided bowel containing hernia extending into the scrotum. Diffuse scrotal wall edema. IMPRESSION: Nondiagnostic scrotal ultrasound as testicles were unable to be visualized secondary to patient's large inguinal hernia. Electronically Signed   By: Duanne Guess D.O.   On: 10/03/2021 10:41     Scheduled Meds:  aspirin EC  81 mg Oral Q breakfast   carvedilol  6.25 mg Oral BID WC   diazepam  5 mg Oral TID   feeding supplement  237 mL Oral BID BM   folic acid  1 mg Oral Daily   insulin aspart  0-15 Units  Subcutaneous Q4H   lisinopril  10 mg Oral Daily   multivitamin with minerals  1 tablet Oral Daily   mupirocin ointment  1 Application Nasal BID   simvastatin  40 mg Oral QHS   tamsulosin  0.4 mg Oral QPC supper   thiamine  100 mg Oral Daily   Or   thiamine  100 mg Intravenous Daily   Continuous Infusions:  cefTRIAXone (ROCEPHIN)  IV 1 g (10/04/21 0229)    LOS: 2 days   Shon Hale M.D on 10/04/2021 at 8:01 PM  Go to [email or web address] - for contact info  [facility] - Office  [phone]  If 7PM-7AM, please contact night-coverage [email or web address] 10/04/2021, 8:01 PM

## 2021-10-04 NOTE — Consult Note (Signed)
Cardiology Consultation:   Patient ID: [name]; [id number]; 1960-06-18   Admit date: 10/02/2021 Date of Consult: 10/04/2021  Primary Care Provider: Toma Deiters, MD Primary Cardiologist: Dina Rich, MD Primary Electrophysiologist: None   Patient Profile:   [name] is a 61 y.o. male with a history of medically managed CAD as of cardiac catheterization in 2010, hypertension, hyperlipidemia, type 2 diabetes mellitus, psoriasis, substance abuse including cocaine, and very large inguinal hernia that has not been intervened upon in many years who is being seen today for the evaluation of preoperative cardiac risk at the request of Dr. Mariea Clonts.  History of Present Illness:   [name] presents with a very large incarcerated left-sided inguinal hernia which by CT examination contains the majority of the sigmoid colon as well as showing urinary bladder extension into the hernia with partial obstruction of the ureters and moderate hydronephrosis.  He is being considered for surgical repair under general anesthesia.  From a cardiac perspective he has had no regular follow-up since seeing Dr. Wyline Mood back in 2015.  He states that he takes a nitroglycerin perhaps once every 2 weeks related to exertional fatigue.  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  There was a 60-70% mid RCA stenosis and moderately high-grade stenosis in the ramus intermedius.  I personally reviewed his ECG which shows sinus rhythm with poor R wave progression and nonspecific T wave changes.  This is unchanged from prior tracing in 2015.  He has a history of substance abuse, reports that this was in the past.  His current UDS however is positive for cocaine  Past Medical  History:  Diagnosis Date   Cocaine abuse (HCC)    Coronary artery disease    Small diagonal subtotally occluded. Cx orginating from the RCA.   Diabetes mellitus    Gout    Hyperlipidemia    Hypertension    Psoriasis     Past Surgical History:  Procedure Laterality Date   none       Inpatient Medications: Scheduled Meds:  aspirin EC  81 mg Oral Q breakfast   carvedilol  6.25 mg Oral BID WC   diazepam  5 mg Oral TID   feeding supplement  237 mL Oral BID BM   folic acid  1 mg Oral Daily   insulin aspart  0-15 Units Subcutaneous Q4H   lisinopril  10 mg Oral Daily   multivitamin with minerals  1 tablet Oral Daily   mupirocin ointment  1 Application Nasal BID   simvastatin  40 mg Oral QHS   tamsulosin  0.4 mg Oral QPC supper   thiamine  100 mg Oral Daily   Or   thiamine  100 mg Intravenous Daily   Continuous Infusions:  cefTRIAXone (ROCEPHIN)  IV 1 g (10/04/21 0229)   PRN Meds: acetaminophen **OR** acetaminophen, LORazepam **OR** LORazepam, ondansetron **OR** ondansetron (ZOFRAN) IV  Allergies:   No Known Allergies  Social History:   Social History   Tobacco Use   Smoking status: Former   Smokeless tobacco: Not on file  Substance Use Topics   Alcohol use: Yes     Family History:   The patient's family history includes Hypertension in his father.  ROS:  Please see the history of present illness.  All other ROS reviewed and negative.     Physical Exam/Data:   Vitals:   10/03/21 0604 10/03/21 1307 10/03/21 2134 10/04/21 0451  BP: (!) 145/89 (!) 150/99 115/60 (!) 144/97  Pulse: 82 (!) 104 99 (!) 110  Resp: 17 20 20 18   Temp: 97.8 F (36.6 C) 98.1 F (36.7 C) 97.8 F (36.6 C) 98 F (36.7 C)  TempSrc: Oral     SpO2: 99% 100% 96% 97%  Weight:      Height:        Intake/Output Summary (Last 24 hours) at 10/04/2021 1339 Last data filed at 10/04/2021 0500 Gross per 24 hour  Intake 821.93 ml  Output 3000 ml  Net -2178.07 ml   Filed Weights   10/02/21  2100  Weight: 97.5 kg   Body mass index is 30.41 kg/m.   Gen: Patient appears comfortable at rest. HEENT: Conjunctiva and lids normal, oropharynx clear. Neck: Supple, no elevated JVP or carotid bruits, no thyromegaly. Lungs: Clear to auscultation, nonlabored breathing at rest. Cardiac: Regular rate and rhythm, no S3, 1/6 systolic murmur, no pericardial rub. Abdomen: Soft, nontender, bowel sounds present.  Very large left-sided inguinal hernia containing intestine. Extremities: No pitting edema, distal pulses 2+. Skin: Warm and dry. Musculoskeletal: No kyphosis. Neuropsychiatric: Alert and oriented x3, affect grossly appropriate.  Telemetry: Currently not on telemetry.  Relevant CV Studies:  Cardiac catheterization 07/18/2008:  ANGIOGRAPHIC DATA:  1. Ventriculography done in the RAO projection revealed vigorous      global systolic function.  Ejection fraction to be estimated 50%.      I did not appreciate a definite wall motion abnormality.  2. The left main coronary artery demonstrates some mild luminal      irregularity throughout.  There is a mid area of plaque      approximately 20%.  In addition, there is a small diagonal branch      that basically appears to be totally occluded and filling in late.      The mid distal portion of the left anterior descending artery has      luminal irregularities but no critical stenoses.  Dr. 07/20/2008 and I      thought that the diagonal could likely represent the acute infarct      vessel.  3. There is a ramus intermedius vessel that bifurcates.  There is a      more medial bifurcation vessel that has probably 75% narrowing.      There is slight decrease density in the LAO caudal view, but in      other views, it appears to be relatively smooth and eccentric.  The      artery distal to that bifurcates and has a fair amount of diffuse      luminal irregularity.  4. 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      There is a common ostium.  The RCA itself has about 30% narrowing      near the proximal portion and then a 40% to 50% area of segmental      narrowing near the proximal mid junction.  Distally, there is a 50-      60% area of fairly smooth vessel.  More distal to this, there is a      posterior descending, posterolateral system with mild luminal      irregularity but  no critical stenosis.  5. 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      One branch of the bifurcating vessel has about 50% narrowing in its      ostium and the other branch is totally occluded and fills by      retrograde collaterals.    CONCLUSIONS:  1. Preserved overall left ventricular function.  2. Total occlusion of the diagonal, which likely represents the acute      infarct vessel.  3. Total occlusion of a circumflex subbranch with retrograde      collateralization.  4. A 60-70% mid right coronary artery stenosis.  5. Moderately high-grade stenosis of the ramus intermedius.  Laboratory Data:  Chemistry Recent Labs  Lab 10/02/21 2130 10/03/21 0538  NA 125* 131*  K 4.3 4.1  CL 91* 97*  CO2 23 27  GLUCOSE 144* 109*  BUN 7* 7*  CREATININE 0.83 0.90  CALCIUM 8.2* 8.3*  GFRNONAA >60 >60  ANIONGAP 11 7    Recent Labs  Lab 10/02/21 2130 10/03/21 0538  PROT 6.7 6.1*  ALBUMIN 3.4* 3.0*  AST 135* 95*  ALT 84* 72*  ALKPHOS 106 96  BILITOT 0.8 0.9   Hematology Recent Labs  Lab 10/02/21 2130 10/03/21 0538  WBC 5.3 4.4  RBC 3.85* 3.67*  HGB 12.7* 12.2*  HCT 37.0* 36.2*  MCV 96.1 98.6  MCH 33.0 33.2  MCHC 34.3 33.7  RDW 13.7 13.7  PLT 265 253    Radiology/Studies:  US Venous Img Lower Unilateral Right (DVT)  Result Date: 10/03/2021 CLINICAL DATA:  Chronic RIGHT lower extremity edema and redness. History of cellulitis. Rule out DVT. EXAM: RIGHT LOWER EXTREMITY VENOUS DOPPLER ULTRASOUND TECHNIQUE:  Gray-scale sonography with compression, as well as color and duplex ultrasound, were performed to evaluate the deep venous system(s) from the level of the common femoral vein through the popliteal and proximal calf veins. COMPARISON:  None Available. FINDINGS: VENOUS Normal compressibility of the common femoral, superficial femoral, and popliteal veins, as well as the visualized calf veins. Visualized portions of profunda femoral vein and great saphenous vein unremarkable. No filling defects to suggest DVT on grayscale or color Doppler imaging. Doppler waveforms show normal direction of venous flow, normal respiratory plasticity and response to augmentation. Limited views of the contralateral common femoral vein are unremarkable. OTHER Ill-defined fluid/edema within the subcutaneous soft tissues of the RIGHT calf. Limitations: none IMPRESSION: Negative.  No DVT is identified within the RIGHT lower extremity. Electronically Signed   By: Bary Richard M.D.   On: 10/03/2021 16:41   CT PELVIS W CONTRAST  Result Date: 10/03/2021 CLINICAL DATA:  Hernia, complicated, assess contents of hernia for operative planning. EXAM: CT PELVIS WITH CONTRAST TECHNIQUE: Multidetector CT imaging of the pelvis was performed using the standard protocol following the bolus administration of intravenous contrast. RADIATION DOSE REDUCTION: This exam was performed according to the departmental dose-optimization program which includes automated exposure control, adjustment of the mA and/or kV according to patient size and/or use of iterative reconstruction technique. CONTRAST:  OMNIPAQUE IOHEXOL 300 MG/ML  SOLN COMPARISON:  Report of CT abdomen and pelvis 10/21/2018 at [facility] [place] FINDINGS: Urinary Tract: The ureters are dilated bilaterally extending into a left inguinal hernia. Majority of the urinary bladder extends into the hernia. Ureterovesical junction is at the upper extent of the hernia. Stranding is present about both  kidneys which are partially imaged. Bowel: Most of the sigmoid colon extends into the left inguinal hernia  without obstruction. Diverticular changes are present in the colon without focal inflammation. Small bowel is unremarkable. No small bowel loops enter the hernia. Vascular/Lymphatic: Atherosclerotic calcifications present the aorta branch vessels. No aneurysm present. Reproductive: Testicles are within normal limits by CT criteria. There is some thickening about the left hemiscrotum associated with the hernia. Other: Paraumbilical hernia contains fat without bowel. The opening is 14 mm. No significant free fluid or free air is present. Musculoskeletal: Bony pelvis is within normal limits. 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 Left hip is within normal limits. IMPRESSION: 1. Large left inguinal hernia contains majority of the sigmoid colon without obstruction. 2. The urinary bladder extends into the left inguinal hernia with partial obstruction of the ureters which are dilated to the level of the kidneys with moderate hydronephrosis and stranding about both kidneys. 3. Colonic diverticulosis without diverticulitis. 4. 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 5. Aortic Atherosclerosis (ICD10-I70.0). Electronically Signed   By: Marin Robertshristopher  Mattern M.D.   On: 10/03/2021 13:12   US SCROTUM  Result Date: 10/03/2021 CLINICAL DATA:  Scrotal edema for 5 years.  Hernia EXAM: ULTRASOUND OF SCROTUM TECHNIQUE: Complete ultrasound examination of the testicles, epididymis, and other scrotal structures was performed. COMPARISON:  None Available. FINDINGS: The bilateral testicles were unable to be visualized secondary to a very large left-sided bowel containing hernia extending into the scrotum. Diffuse scrotal wall edema. IMPRESSION: Nondiagnostic scrotal  ultrasound as testicles were unable to be visualized secondary to patient's large inguinal hernia. Electronically Signed   By: Duanne GuessNicholas  Plundo D.O.   On: 10/03/2021 10:41    Assessment and Plan:   1.  Preoperative cardiac evaluation in a 61 year old male with known ischemic heart disease as discussed above, medically managed in 2010 with branch vessel disease involving the LAD and circumflex, also moderate RCA and ramus intermedius stenosis.  He is being considered for inguinal herniorrhaphy under general anesthesia.  RCRI perioperative cardiac risk index is class IV, high risk at 11% chance of major adverse cardiac event.  Also type 2 diabetes mellitus, currently on insulin, hypertension, hyperlipidemia, and cocaine use with UDS positive at this time.  He reports nitroglycerin use at least once every 2 weeks due to exertional fatigue.  Suspect he has had progression in CAD over the years and with continued cocaine abuse increased risk for vasculopathy as well.  2.  Alcohol abuse, drinks vodka daily at home.  Currently on CIWA protocol.  3.  Right lower extremity cellulitis on Rocephin.  Lower extremity venous Dopplers negative for DVT on the right.  4.  Mild transaminitis, possibly related to alcohol use.  5.  Essential hypertension, currently on Coreg and lisinopril.  Difficult situation as patient is high risk for surgery and to a large degree this is not a modifiable risk.  If surgical intervention is necessary, [facility].  From the perspective of his cardiac status, would recommend an echocardiogram, also get at least 1 set of high-sensitivity troponin I levels.  He is not an optimal candidate for either Lexiscan Myoview or a coronary CTA in light of UDS positive for cocaine and also has elevated heart rate.  Also not enthusiastic about proceeding to a diagnostic cardiac catheterization unless of course he has LV dysfunction and his cardiac enzymes are  elevated.  If he did undergo coronary revascularization  however, this would delay herniorrhaphy for at least 6 months.  If he is ultimately transferred to the hospitalist service at [facility], our cardiology team can continue to follow.  Signed, Nona Dell, MD  10/04/2021 1:39 PM

## 2021-10-04 NOTE — Progress Notes (Addendum)
I was present with the medical student for this service. I personally verified the history of present illness, performed the physical exam, and made the plan for this encounter. I have verified the medical student's documentation and made modifications where appropriately. I have personally documented in my own words a brief history, physical, and plan below.     Patient eager to get hernia repaired but when I saw him I discussed need for cardiac work up. He has been deemed high risk and is not appropriate candidate for surgery at [facility]. He is developing hydronephrosis but no kidney dysfunction to date with lab work, but is getting somewhat obstructed in his urinary system based on his description to me having to compress the area with a cup to get any urine out.   He is not obstructed in his bowels and the sigmoid colon looks normal in the hernia.    I have sent [facility] an epic chat and talked with one of the Pas to determine if he will be a candidate for any surgery down at [facility] and they do not feel like he is appropriate for them at this time. I have sent Dr. [facility] a message to see is thoughts on longterm management of a bladder in the scrotum to this extent pending the patient being able to get any surgery.   It is looking like he will need transfer to a tertiary care facility for further care given his cardiac risk and need for more urgent/ not emergent repair.   Algis Greenhouse, MD [facility] [address] [place], [place] [address] [phone] (office)        Subjective: [name] is a 61 y.o. male with a past medical history of CAD, HLD,  essential hypertension, inguinal hernia, and alcohol use disorder presenting to the ED with one day of right leg swelling with incidental finding of large left inguinal hernia on examination, now admitted for DVT workup and surgical repair of hernia.  Today is hospital day 2 for the patient and he  reports he is mostly feeling well.  After his Foley catheter was placed, he feels a bit less abdominal pressure.  His main complaint is his chronic back pain, which makes it difficult for him to get comfortable.  He notes significant anxiety about being in the hospital and getting surgery.  He has been able to ambulate around his room with mild difficulty, steadying himself using the wall.  He reports that he has not had a BM since admission, though he has also not been eating due to NPO orders.  The patient denies chest pain, dyspnea, abdominal pain, hematuria, and nausea/vomiting/diarrhea.   Objective: Vital signs in last 24 hours: Temp:  [97.8 F (36.6 C)-98.1 F (36.7 C)] 98 F (36.7 C) (09/11 0451) Pulse Rate:  [99-110] 110 (09/11 0451) Resp:  [18-20] 18 (09/11 0451) BP: (115-150)/(60-99) 144/97 (09/11 0451) SpO2:  [96 %-100 %] 97 % (09/11 0451) Last BM Date : 10/02/21  Intake/Output from previous day: 09/10 0701 - 09/11 0700 In: 1061.9 [P.O.:480; I.V.:581.9] Out: 3000 [Urine:3000] Intake/Output this shift: No intake/output data recorded.  Physical Exam Constitutional:      General: He is not in acute distress.    Appearance: He is obese.  HENT:     Head: Normocephalic and atraumatic.  Cardiovascular:     Rate and Rhythm: Normal rate and regular rhythm.     Pulses: Normal pulses.     Heart sounds: Normal heart  sounds.  Pulmonary:     Effort: Pulmonary effort is normal.     Breath sounds: Normal breath sounds.  Abdominal:     General: Bowel sounds are normal. There is no distension.     Palpations: Abdomen is soft.     Tenderness: There is no right CVA tenderness, left CVA tenderness or guarding.     Hernia: A hernia (left inguinal hernia with mild tenderness to palpation) is present.  Genitourinary:    Comments: Testes unable to be visualized due to hernia Foley catheter present Musculoskeletal:        General: Swelling present.     Right lower leg: Edema (2+ pitting  edema up to knee) present.     Left lower leg: Edema present.     Comments: R>L  Skin:    General: Skin is warm and dry.     Capillary Refill: Capillary refill takes less than 2 seconds.     Coloration: Skin is not jaundiced.  Neurological:     General: No focal deficit present.     Mental Status: He is alert.  Psychiatric:        Mood and Affect: Mood normal.        Behavior: Behavior normal.        Judgment: Judgment normal.     Comments: Mildly anxious about surgery and somewhat restless      Lab Results:  Recent Labs    10/02/21 2130 10/03/21 0538  WBC 5.3 4.4  HGB 12.7* 12.2*  HCT 37.0* 36.2*  PLT 265 253   BMET Recent Labs    10/02/21 2130 10/03/21 0538  NA 125* 131*  K 4.3 4.1  CL 91* 97*  CO2 23 27  GLUCOSE 144* 109*  BUN 7* 7*  CREATININE 0.83 0.90  CALCIUM 8.2* 8.3*   PT/INR No results for input(s): "LABPROT", "INR" in the last 72 hours.  Studies/Results: US Venous Img Lower Unilateral Right (DVT)  Result Date: 10/03/2021 CLINICAL DATA:  Chronic RIGHT lower extremity edema and redness. History of cellulitis. Rule out DVT. EXAM: RIGHT LOWER EXTREMITY VENOUS DOPPLER ULTRASOUND TECHNIQUE: Gray-scale sonography with compression, as well as color and duplex ultrasound, were performed to evaluate the deep venous system(s) from the level of the common femoral vein through the popliteal and proximal calf veins. COMPARISON:  None Available. FINDINGS: VENOUS Normal compressibility of the common femoral, superficial femoral, and popliteal veins, as well as the visualized calf veins. Visualized portions of profunda femoral vein and great saphenous vein unremarkable. No filling defects to suggest DVT on grayscale or color Doppler imaging. Doppler waveforms show normal direction of venous flow, normal respiratory plasticity and response to augmentation. Limited views of the contralateral common femoral vein are unremarkable. OTHER Ill-defined fluid/edema within the  subcutaneous soft tissues of the RIGHT calf. Limitations: none IMPRESSION: Negative.  No DVT is identified within the RIGHT lower extremity. Electronically Signed   By: Franki Cabot M.D.   On: 10/03/2021 16:41   CT PELVIS W CONTRAST  Result Date: 10/03/2021 CLINICAL DATA:  Hernia, complicated, assess contents of hernia for operative planning. EXAM: CT PELVIS WITH CONTRAST TECHNIQUE: Multidetector CT imaging of the pelvis was performed using the standard protocol following the bolus administration of intravenous contrast. RADIATION DOSE REDUCTION: This exam was performed according to the departmental dose-optimization program which includes automated exposure control, adjustment of the mA and/or kV according to patient size and/or use of iterative reconstruction technique. CONTRAST:  166mL OMNIPAQUE IOHEXOL 300 MG/ML  SOLN  COMPARISON:  Report of CT abdomen and pelvis 10/21/2018 at [facility] [place] FINDINGS: Urinary Tract: The ureters are dilated bilaterally extending into a left inguinal hernia. Majority of the urinary bladder extends into the hernia. Ureterovesical junction is at the upper extent of the hernia. Stranding is present about both kidneys which are partially imaged. Bowel: Most of the sigmoid colon extends into the left inguinal hernia without obstruction. Diverticular changes are present in the colon without focal inflammation. Small bowel is unremarkable. No small bowel loops enter the hernia. Vascular/Lymphatic: Atherosclerotic calcifications present the aorta branch vessels. No aneurysm present. Reproductive: Testicles are within normal limits by CT criteria. There is some thickening about the left hemiscrotum associated with the hernia. Other: Paraumbilical hernia contains fat without bowel. The opening is 14 mm. No significant free fluid or free air is present. Musculoskeletal: Bony pelvis is within normal limits. 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 Left hip is within normal limits. IMPRESSION: 1. Large left inguinal hernia contains majority of the sigmoid colon without obstruction. 2. The urinary bladder extends into the left inguinal hernia with partial obstruction of the ureters which are dilated to the level of the kidneys with moderate hydronephrosis and stranding about both kidneys. 3. Colonic diverticulosis without diverticulitis. 4. 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 5. Aortic Atherosclerosis (ICD10-I70.0). Electronically Signed   By: Marin Roberts M.D.   On: 10/03/2021 13:12   US SCROTUM  Result Date: 10/03/2021 CLINICAL DATA:  Scrotal edema for 5 years.  Hernia EXAM: ULTRASOUND OF SCROTUM TECHNIQUE: Complete ultrasound examination of the testicles, epididymis, and other scrotal structures was performed. COMPARISON:  None Available. FINDINGS: The bilateral testicles were unable to be visualized secondary to a very large left-sided bowel containing hernia extending into the scrotum. Diffuse scrotal wall edema. IMPRESSION: Nondiagnostic scrotal ultrasound as testicles were unable to be visualized secondary to patient's large inguinal hernia. Electronically Signed   By: Duanne Guess D.O.   On: 10/03/2021 10:41    Anti-infectives: Anti-infectives (From admission, onward)    Start     Dose/Rate Route Frequency Ordered Stop   10/03/21 0230  cefTRIAXone (ROCEPHIN) 1 g in sodium chloride 0.9 % 100 mL IVPB        1 g 200 mL/hr over 30 Minutes Intravenous Every 24 hours 10/03/21 0131 10/10/21 0229       Assessment/Plan: Planned procedure: HERNIA REPAIR INGUINAL INCARCERATED  Currently, patient requires cardiology evaluation of echocardiogram prior to surgery due to underlying cardiac risk factors and home nitroglycerin use.  Pending cardiac clearance, patient can undergo left incarcerated inguinal  hernia repair.  Right lower extremity ultrasound did not reveal any DVTs, which is reassuring.  However, etiology of pitting edema of right LE greater than left LE is unclear.  The patient does have multiple risk factors for the post-operative period due to social determinants of health factors and good follow-up care with PCP will be important to emphasize following this hospitalization.   LOS: 2 days    Dimitry [facility] 10/04/2021  8:39 AM

## 2021-10-05 DIAGNOSIS — M7989 Other specified soft tissue disorders: Secondary | ICD-10-CM | POA: Diagnosis not present

## 2021-10-05 LAB — COMPREHENSIVE METABOLIC PANEL
ALT: 37 U/L (ref 0–44)
AST: 29 U/L (ref 15–41)
Albumin: 2.8 g/dL — ABNORMAL LOW (ref 3.5–5.0)
Alkaline Phosphatase: 83 U/L (ref 38–126)
Anion gap: 6 (ref 5–15)
BUN: 12 mg/dL (ref 8–23)
CO2: 27 mmol/L (ref 22–32)
Calcium: 8.1 mg/dL — ABNORMAL LOW (ref 8.9–10.3)
Chloride: 100 mmol/L (ref 98–111)
Creatinine, Ser: 0.87 mg/dL (ref 0.61–1.24)
GFR, Estimated: >60 mL/min (ref >60)
Glucose, Bld: 162 mg/dL — ABNORMAL HIGH (ref 70–99)
Potassium: 3.3 mmol/L — ABNORMAL LOW (ref 3.5–5.1)
Sodium: 133 mmol/L — ABNORMAL LOW (ref 135–145)
Total Bilirubin: 0.4 mg/dL (ref 0.3–1.2)
Total Protein: 5.9 g/dL — ABNORMAL LOW (ref 6.5–8.1)

## 2021-10-05 LAB — PROTIME-INR
INR: 1 (ref 0.8–1.2)
Prothrombin Time: 13 seconds (ref 11.4–15.2)

## 2021-10-05 LAB — CBC
HCT: 33 % — ABNORMAL LOW (ref 39.0–52.0)
Hemoglobin: 11.1 g/dL — ABNORMAL LOW (ref 13.0–17.0)
MCH: 33.7 pg (ref 26.0–34.0)
MCHC: 33.6 g/dL (ref 30.0–36.0)
MCV: 100.3 fL — ABNORMAL HIGH (ref 80.0–100.0)
Platelets: 279 10*3/uL (ref 150–400)
RBC: 3.29 MIL/uL — ABNORMAL LOW (ref 4.22–5.81)
RDW: 14 % (ref 11.5–15.5)
WBC: 4.8 10*3/uL (ref 4.0–10.5)
nRBC: 0 % (ref 0.0–0.2)

## 2021-10-05 LAB — MAGNESIUM: Magnesium: 1.7 mg/dL (ref 1.7–2.4)

## 2021-10-05 LAB — GLUCOSE, CAPILLARY
Glucose-Capillary: 101 mg/dL — ABNORMAL HIGH (ref 70–99)
Glucose-Capillary: 116 mg/dL — ABNORMAL HIGH (ref 70–99)
Glucose-Capillary: 153 mg/dL — ABNORMAL HIGH (ref 70–99)

## 2021-10-05 SURGERY — REPAIR, HERNIA, INGUINAL, INCARCERATED
Anesthesia: General | Laterality: Left

## 2021-10-05 MED ORDER — LISINOPRIL 10 MG PO TABS: 10.0000 mg | ORAL_TABLET | Freq: Every day | ORAL | 3 refills | Status: AC

## 2021-10-05 MED ORDER — SIMVASTATIN 40 MG PO TABS: 40.0000 mg | ORAL_TABLET | Freq: Every day | ORAL | 11 refills | Status: AC

## 2021-10-05 MED ORDER — CARVEDILOL 25 MG PO TABS: 25.0000 mg | ORAL_TABLET | Freq: Two times a day (BID) | ORAL | 3 refills | Status: AC

## 2021-10-05 MED ORDER — DOXYCYCLINE HYCLATE 100 MG PO TABS: 100.0000 mg | ORAL_TABLET | Freq: Two times a day (BID) | ORAL | 0 refills | Status: AC

## 2021-10-05 MED ORDER — FUROSEMIDE 20 MG PO TABS: 20.0000 mg | ORAL_TABLET | Freq: Every day | ORAL | 3 refills | Status: AC

## 2021-10-05 MED ORDER — POTASSIUM CHLORIDE CRYS ER 20 MEQ PO TBCR
40.0000 meq | EXTENDED_RELEASE_TABLET | ORAL | Status: DC
  Administered 2021-10-05: 40 meq via ORAL
  Filled 2021-10-05: qty 2

## 2021-10-05 MED ORDER — ADULT MULTIVITAMIN W/MINERALS CH: 1.0000 | ORAL_TABLET | Freq: Every day | ORAL | 2 refills | Status: AC

## 2021-10-05 MED ORDER — ASPIRIN 81 MG PO TABS: 81.0000 mg | ORAL_TABLET | Freq: Every day | ORAL | 2 refills | Status: AC

## 2021-10-05 MED ORDER — CEPHALEXIN 500 MG PO CAPS: 500.0000 mg | ORAL_CAPSULE | Freq: Three times a day (TID) | ORAL | 0 refills | Status: AC

## 2021-10-05 MED ORDER — TAMSULOSIN HCL 0.4 MG PO CAPS: 0.4000 mg | ORAL_CAPSULE | Freq: Every day | ORAL | 3 refills | Status: AC

## 2021-10-05 MED ORDER — ISOSORBIDE MONONITRATE ER 60 MG PO TB24: 60.0000 mg | ORAL_TABLET | Freq: Every day | ORAL | 3 refills | Status: AC

## 2021-10-05 MED ORDER — METFORMIN HCL 500 MG PO TABS: 500.0000 mg | ORAL_TABLET | Freq: Every day | ORAL | 3 refills | Status: AC

## 2021-10-05 NOTE — Discharge Instructions (Signed)
1)Follow up with Dr.  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 to prevent further complications including possible bowel obstruction and possible obstruction of the ureters that can lead to kidney failure --Your hernia surgery will be rather complex and will need to be done at [facility] in [place] or [facility] in [facility] or [facility] in [place] 3) you need repeat CBC and BMP blood test within the next 1 week 4) complete abstinence from alcohol, cocaine and other illicit drugs advised--- consider outpatient drug rehab   Indwelling Urinary Catheter Care, Adult An indwelling urinary catheter is a thin tube that is put into your bladder. The tube helps to drain pee (urine) out of your body. The tube goes in through your urethra. Your urethra is where pee comes out of your body. Your pee will come out through the catheter, then it will go into a bag (drainage bag). Take good care of your catheter so it will work well. What are the risks? Germs may get into your bladder and cause an infection. The tube can become blocked. Tissue near the catheter may become irritated and may bleed. How to wear your catheter and drainage bag Supplies needed Sticky tape (adhesive tape) or a leg strap. Alcohol wipe or soap and water (if you use tape). A clean towel (if you use tape). Large overnight bag. Smaller bag (leg bag). Wearing your catheter Attach your catheter to your leg with tape or a leg strap. Make sure the catheter is not pulled tight. If a leg strap gets wet, take it off and put on a dry strap. If you use tape to hold the bag on your leg: Use an alcohol wipe or soap and water to wash your skin where the tape made it sticky before. Use a clean towel to pat-dry that skin. Use new tape to make the bag stay on your leg. Wearing your  bags You should have been given a large overnight bag. You may wear the overnight bag in the day or night. Always have the overnight bag lower than your bladder.  Do not let the bag touch the floor. Before you go to sleep, put a clean plastic bag in a wastebasket. Then, hang the overnight bag inside the wastebasket. You should also have a smaller leg bag that fits under your clothes. Wear the leg bag as told by the product maker. This may be above or below the knee, depending on the length of the tubing. Make sure that the leg bag is below the bladder. Make sure that the tubing does not have loops or too much tension. Do not wear your leg bag at night. How to care for your skin and catheter Supplies needed A clean washcloth. Water and mild soap. A clean towel. Caring for your skin and catheter Male anatomy showing the labia, urethra, and an indwelling urinary catheter in the bladder.     Male anatomy showing the penis, urethra, and an indwelling urinary catheter in the bladder.   Clean the skin around your catheter every day. Wash your hands with soap and water. Wet a clean washcloth in warm water and mild soap. Clean the skin around your urethra. If you are male: Gently spread the folds of skin around your vagina (labia). With the washcloth in your other hand, wipe the inner side of your labia on  each side. Wipe from front to back. If you are male: Pull back any skin that covers the end of your penis (foreskin). With the washcloth in your other hand, wipe your penis in small circles. Start wiping at the tip of your penis, then move away from the catheter. Move the foreskin back in place, if needed. With your free hand, hold the catheter close to where it goes into your body. Keep holding the catheter during cleaning so it does not get pulled out. With the washcloth in your other hand, clean the catheter. Only wipe downward on the catheter, toward the drainage bag. Do not  wipe upward toward your body. Doing this may push germs into your urethra and cause infection. Use a clean towel to pat-dry the catheter and the skin around it. Make sure to wipe off all soap. Wash your hands with soap and water. Shower every day. Do not take baths. Do not use cream, ointment, or lotion on the area where the catheter goes into your body, unless your doctor tells you to. Do not use powders, sprays, or lotions on your genital area. Check your skin around the catheter every day for signs of infection. Check for: Redness, swelling, or pain. Fluid or blood. Warmth. Pus or a bad smell. How to empty the bag Supplies needed Rubbing alcohol. Gauze pad or cotton ball. Tape or a leg strap. Emptying the bag Pour the pee out of your bag when it is ?- full, or at least 2-3 times a day. Do this for your overnight bag and your leg bag. Wash your hands with soap and water. Separate (detach) the bag from your leg. Hold the bag over the toilet or a clean pail. Keep the bag lower than your hips and bladder. This is so the pee (urine) does not go back into the tube. Open the pour spout. It is at the bottom of the bag. Empty the pee into the toilet or pail. Do not let the pour spout touch any surface. Put rubbing alcohol on a gauze pad or cotton ball. Use the gauze pad or cotton ball to clean the pour spout. Close the pour spout. Attach the bag to your leg with tape or a leg strap. Wash your hands with soap and water. Follow instructions for cleaning the drainage bag. Instructions can come from: The product maker. Your doctor. How to change the bag Changing the bag Replace your bag when it starts to leak, smell bad, or look dirty. Wash your hands with soap and water. Separate the dirty bag from your leg. Pinch the catheter with your fingers so that pee does not spill out. Separate the catheter tube from the bag tube where these tubes connect (at the connection valve). Do not let the  tubes touch any surface. Clean the end of the catheter tube with an alcohol wipe. Use a different alcohol wipe to clean the end of the bag tube. Connect the catheter tube to the tube of the clean bag. Attach the clean bag to your leg with tape or a leg strap. Do not make the bag tight on your leg. Wash your hands with soap and water. General instructions A person washing hands with soap and water.   Never pull on your catheter. Never try to take it out. Doing that can hurt you. Always wash your hands before and after you touch your catheter or bag. Use a mild, fragrance-free soap. If you do not have soap and water, use hand  sanitizer. Always make sure there are no twists, bends, or kinks in the catheter tube. Always make sure there are no leaks in the catheter or bag. Drink enough fluid to keep your pee pale yellow. Do not take baths, swim, or use a hot tub. If you are male, wipe from front to back after you poop (have a bowel movement). Contact a doctor if: Your catheter gets clogged. Your catheter leaks. You have signs of infection at the catheter site, such as: Redness, swelling, or pain where the catheter goes into your body. Fluid, blood, pus, or a bad smell coming from the area where the catheter goes into your body. Skin feels warm where the catheter goes into your body. You have signs of a bladder infection, such as: Fever. Chills. Pee smells worse than usual. Cloudy pee. Pain in your belly, legs, lower back, or bladder. Vomiting or feel like vomiting. Get help right away if: You see blood in the catheter. Your pee is pink or red. Your bladder feels full. Your pee is not draining into the bag. Your catheter gets pulled out. Summary An indwelling urinary catheter is a thin tube that is placed into the bladder to help drain pee (urine) out of the body. The catheter is placed into the part of the body that drains pee from the bladder (urethra). Taking good care of your  catheter will keep it working well. Always wash your hands before and after touching your catheter or bag. Never pull on your catheter or try to take it out. This information is not intended to replace advice given to you by your health care provider. Make sure you discuss any questions you have with your health care provider. Document Revised: 09/10/2020 Document Reviewed: 09/10/2020 Elsevier Patient Education  2023 [employer].

## 2021-10-05 NOTE — Discharge Summary (Signed)
[name], is a 60 y.o. male  DOB 04/17/1960  MRN [id number].  Admission date:  10/02/2021  Admitting Physician  Frankey Shown, DO  Discharge Date:  10/05/2021   Primary MD  Toma Deiters, MD  Recommendations for primary care physician for things to follow:   1)Follow up with Dr.  Toma Deiters, MD your primary care physician to change your Foley urinary catheter at least once a month 2)You need Surgery on your rather large inguino-scrotal Hernia to prevent further complications including possible bowel obstruction and possible obstruction of the ureters that can lead to kidney failure --Your hernia surgery will be rather complex and will need to be done at [facility] in [place] or [facility] in [facility] or [facility] in [place] 3) you need repeat CBC and BMP blood test within the next 1 week 4) complete abstinence from alcohol, cocaine and other illicit drugs advised--- consider outpatient drug rehab  Admission Diagnosis  Hyponatremia [E87.1] Leg swelling [M79.89] Inguinal hernia [K40.90]   Discharge Diagnosis  Hyponatremia [E87.1] Leg swelling [M79.89] Inguinal hernia [K40.90]  ***  Principal Problem:   Swelling of right lower extremity Active Problems:   Essential hypertension   CAD, NATIVE VESSEL   Incarcerated left inguinal hernia   Cellulitis of right leg   Hyponatremia   Transaminitis   Uncontrolled type 2 diabetes mellitus with hyperglycemia, without long-term current use of insulin (HCC)   Hypoalbuminemia due to protein-calorie malnutrition (HCC)   Alcohol abuse   Obesity (BMI 30-39.9)      Past Medical History:  Diagnosis Date   Cocaine abuse (HCC)    Coronary artery disease    Small diagonal subtotally occluded. Cx orginating from the RCA.   Diabetes mellitus    Gout    Hyperlipidemia    Hypertension    Psoriasis     Past Surgical  History:  Procedure Laterality Date   none         HPI  from the history and physical done on the day of admission:     ***  ****     Hospital Course:     No notes on file  ***** Assessment and Plan: No notes have been filed under this hospital service. Service: Hospitalist       Discharge Condition: ***  Follow UP     Consults obtained - ***  Diet and Activity recommendation:  As advised  Discharge Instructions    **** Discharge Instructions     Call MD for:  difficulty breathing, headache or visual disturbances   Complete by: As directed    Call MD for:  persistant dizziness or light-headedness   Complete by: As directed    Call MD for:  persistant nausea and vomiting   Complete by: As directed    Call MD for:  temperature >100.4   Complete by: As directed    Diet - low sodium heart healthy   Complete by: As directed    Discharge instructions  Complete by: As directed    1)Follow up with Dr.  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 to prevent further complications including possible bowel obstruction and possible obstruction of the ureters that can lead to kidney failure --Your hernia surgery will be rather complex and will need to be done at [facility] in [place] or [facility] in [facility] or [facility] in [place] 3) you need repeat CBC and BMP blood test within the next 1 week 4) complete abstinence from alcohol, cocaine and other illicit drugs advised--- consider outpatient drug rehab   Increase activity slowly   Complete by: As directed          Discharge Medications     Allergies as of 10/05/2021   No Known Allergies      Medication List     STOP taking these medications    calcipotriene-betamethasone ointment Commonly known as: TACLONEX   chlorthalidone 25 MG  tablet Commonly known as: HYGROTON   traMADol 50 MG tablet Commonly known as: ULTRAM       TAKE these medications    aspirin 81 MG tablet Take 1 tablet (81 mg total) by mouth daily with breakfast. What changed: when to take this   carvedilol 25 MG tablet Commonly known as: COREG Take 1 tablet (25 mg total) by mouth 2 (two) times daily.   cephALEXin 500 MG capsule Commonly known as: Keflex Take 1 capsule (500 mg total) by mouth 3 (three) times daily for 5 days.   cyclobenzaprine 10 MG tablet Commonly known as: FLEXERIL Take 10 mg by mouth at bedtime.   doxycycline 100 MG tablet Commonly known as: VIBRA-TABS Take 1 tablet (100 mg total) by mouth 2 (two) times daily for 5 days.   furosemide 20 MG tablet Commonly known as: LASIX Take 1 tablet (20 mg total) by mouth daily.   isosorbide mononitrate 60 MG 24 hr tablet Commonly known as: IMDUR Take 1 tablet (60 mg total) by mouth daily.   lisinopril 10 MG tablet Commonly known as: ZESTRIL Take 1 tablet (10 mg total) by mouth daily. What changed: Another medication with the same name was removed. Continue taking this medication, and follow the directions you see here.   metFORMIN 500 MG tablet Commonly known as: GLUCOPHAGE Take 1 tablet (500 mg total) by mouth daily with breakfast. What changed: when to take this   methotrexate 2.5 MG tablet Commonly known as: RHEUMATREX Take 10 mg by mouth once a week.   multivitamin with minerals Tabs tablet Take 1 tablet by mouth daily. Start taking on: October 06, 2021   nitroGLYCERIN 0.4 MG SL tablet Commonly known as: NITROSTAT Place 1 tablet (0.4 mg total) under the tongue every 5 (five) minutes as needed.   simvastatin 40 MG tablet Commonly known as: ZOCOR Take 1 tablet (40 mg total) by mouth at bedtime.   tamsulosin 0.4 MG Caps capsule Commonly known as: FLOMAX Take 1 capsule (0.4 mg total) by mouth daily after supper.        Major procedures and Radiology  Reports - PLEASE review detailed and final reports for all details, in brief -   ***  ECHOCARDIOGRAM COMPLETE  Result Date: 10/04/2021    ECHOCARDIOGRAM REPORT   Patient Name:   FODAY CONE Date of Exam: 10/04/2021 Medical Rec #:  [id number]  Height:       70.5 in Accession #:    [id number]       Weight:       215.0 lb Date of Birth:  May 28, 1960        BSA:          2.164 m Patient Age:    61 years         BP:           144/97 mmHg Patient Gender: M                HR:           95 bpm. [facility] Procedure: 2D Echo, Cardiac Doppler and Color Doppler Indications:    Dyspnea R06.00  History:        Patient has no prior history of Echocardiogram examinations.                 CAD; Risk Factors:Hypertension, Diabetes and Dyslipidemia.                 Alcohol abuse, SUBSTANCE ABUSE.  Sonographer:    Celesta Gentile RCS Referring Phys: [phone] Myangel Summons IMPRESSIONS  1. Left ventricular ejection fraction, by estimation, is 60 to 65%. The left ventricle has normal function. The left ventricle has no regional wall motion abnormalities. There is mild concentric left ventricular hypertrophy. Left ventricular diastolic parameters are consistent with Grade I diastolic dysfunction (impaired relaxation).  2. Right ventricular systolic function is normal. The right ventricular size is normal. Tricuspid regurgitation signal is inadequate for assessing PA pressure.  3. Left atrial size was moderately dilated.  4. The mitral valve is grossly normal. Mild mitral valve regurgitation.  5. The aortic valve was not well visualized. Aortic valve regurgitation is mild.  6. The inferior vena cava is normal in size with greater than 50% respiratory variability, suggesting right atrial pressure of 3 mmHg.  7. Prominent epicardial fat pad and trivial anterior pericardial effusion. Comparison(s): No prior Echocardiogram. FINDINGS  Left Ventricle: Left ventricular ejection fraction, by estimation, is 60 to 65%. The  left ventricle has normal function. The left ventricle has no regional wall motion abnormalities. The left ventricular internal cavity size was normal in size. There is  mild concentric left ventricular hypertrophy. Left ventricular diastolic parameters are consistent with Grade I diastolic dysfunction (impaired relaxation). Right Ventricle: The right ventricular size is normal. No increase in right ventricular wall thickness. Right ventricular systolic function is normal. Tricuspid regurgitation signal is inadequate for assessing PA pressure. Left Atrium: Left atrial size was moderately dilated. Right Atrium: Right atrial size was normal in size. Pericardium: Trivial pericardial effusion is present. The pericardial effusion is anterior to the right ventricle. Presence of epicardial fat layer. Mitral Valve: The mitral valve is grossly normal. Mild mitral valve regurgitation. Tricuspid Valve: The tricuspid valve is grossly normal. Tricuspid valve regurgitation is trivial. Aortic Valve: The aortic valve was not well visualized. There is mild aortic valve annular calcification. Aortic valve regurgitation is mild. Pulmonic Valve: The pulmonic valve was grossly normal. Pulmonic valve regurgitation is trivial. Aorta: The aortic root is normal in size and structure. Venous: The inferior vena cava is normal in size with greater than 50% respiratory variability, suggesting right atrial pressure of 3 mmHg. IAS/Shunts: The interatrial septum appears to be lipomatous. No atrial level shunt detected by color flow Doppler.  LEFT VENTRICLE PLAX 2D LVIDd:         4.50 cm LVIDs:  3.10 cm LV PW:         1.20 cm LV IVS:        1.30 cm LVOT diam:     2.10 cm LV SV:         84 LV SV Index:   39 LVOT Area:     3.46 cm  RIGHT VENTRICLE RV S prime:     14.10 cm/s TAPSE (M-mode): 2.1 cm LEFT ATRIUM              Index        RIGHT ATRIUM           Index LA diam:        4.20 cm  1.94 cm/m   RA Area:     15.50 cm LA Vol (A2C):   89.8  ml  41.50 ml/m  RA Volume:   38.00 ml  17.56 ml/m LA Vol (A4C):   111.0 ml 51.30 ml/m LA Biplane Vol: 102.0 ml 47.14 ml/m  AORTIC VALVE LVOT Vmax:   119.00 cm/s LVOT Vmean:  82.600 cm/s LVOT VTI:    0.243 m  AORTA Ao Root diam: 3.40 cm MITRAL VALVE MV Area (PHT): 2.81 cm     SHUNTS MV Decel Time: 270 msec     Systemic VTI:  0.24 m MV E velocity: 73.00 cm/s   Systemic Diam: 2.10 cm MV A velocity: 110.50 cm/s MV E/A ratio:  0.66 Nona Dell MD Electronically signed by Nona Dell MD Signature Date/Time: 10/04/2021/4:21:42 PM    Final    US Venous Img Lower Unilateral Right (DVT)  Result Date: 10/03/2021 CLINICAL DATA:  Chronic RIGHT lower extremity edema and redness. History of cellulitis. Rule out DVT. EXAM: RIGHT LOWER EXTREMITY VENOUS DOPPLER ULTRASOUND TECHNIQUE: Gray-scale sonography with compression, as well as color and duplex ultrasound, were performed to evaluate the deep venous system(s) from the level of the common femoral vein through the popliteal and proximal calf veins. COMPARISON:  None Available. FINDINGS: VENOUS Normal compressibility of the common femoral, superficial femoral, and popliteal veins, as well as the visualized calf veins. Visualized portions of profunda femoral vein and great saphenous vein unremarkable. No filling defects to suggest DVT on grayscale or color Doppler imaging. Doppler waveforms show normal direction of venous flow, normal respiratory plasticity and response to augmentation. Limited views of the contralateral common femoral vein are unremarkable. OTHER Ill-defined fluid/edema within the subcutaneous soft tissues of the RIGHT calf. Limitations: none IMPRESSION: Negative.  No DVT is identified within the RIGHT lower extremity. Electronically Signed   By: Bary Richard M.D.   On: 10/03/2021 16:41   CT PELVIS W CONTRAST  Result Date: 10/03/2021 CLINICAL DATA:  Hernia, complicated, assess contents of hernia for operative planning. EXAM: CT PELVIS WITH  CONTRAST TECHNIQUE: Multidetector CT imaging of the pelvis was performed using the standard protocol following the bolus administration of intravenous contrast. RADIATION DOSE REDUCTION: This exam was performed according to the departmental dose-optimization program which includes automated exposure control, adjustment of the mA and/or kV according to patient size and/or use of iterative reconstruction technique. CONTRAST:  OMNIPAQUE IOHEXOL 300 MG/ML  SOLN COMPARISON:  Report of CT abdomen and pelvis 10/21/2018 at [facility] [place] FINDINGS: Urinary Tract: The ureters are dilated bilaterally extending into a left inguinal hernia. Majority of the urinary bladder extends into the hernia. Ureterovesical junction is at the upper extent of the hernia. Stranding is present about both kidneys which are partially imaged. Bowel: Most of the sigmoid colon  extends into the left inguinal hernia without obstruction. Diverticular changes are present in the colon without focal inflammation. Small bowel is unremarkable. No small bowel loops enter the hernia. Vascular/Lymphatic: Atherosclerotic calcifications present the aorta branch vessels. No aneurysm present. Reproductive: Testicles are within normal limits by CT criteria. There is some thickening about the left hemiscrotum associated with the hernia. Other: Paraumbilical hernia contains fat without bowel. The opening is 14 mm. No significant free fluid or free air is present. Musculoskeletal: Bony pelvis is within normal limits. 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 Left hip is within normal limits. IMPRESSION: 1. Large left inguinal hernia contains majority of the sigmoid colon without obstruction. 2. The urinary bladder extends into the left inguinal hernia with partial obstruction of the ureters which are dilated to the level of the kidneys with moderate hydronephrosis and stranding  about both kidneys. 3. Colonic diverticulosis without diverticulitis. 4. 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 5. Aortic Atherosclerosis (ICD10-I70.0). Electronically Signed   By: Marin Robertshristopher  Mattern M.D.   On: 10/03/2021 13:12   US SCROTUM  Result Date: 10/03/2021 CLINICAL DATA:  Scrotal edema for 5 years.  Hernia EXAM: ULTRASOUND OF SCROTUM TECHNIQUE: Complete ultrasound examination of the testicles, epididymis, and other scrotal structures was performed. COMPARISON:  None Available. FINDINGS: The bilateral testicles were unable to be visualized secondary to a very large left-sided bowel containing hernia extending into the scrotum. Diffuse scrotal wall edema. IMPRESSION: Nondiagnostic scrotal ultrasound as testicles were unable to be visualized secondary to patient's large inguinal hernia. Electronically Signed   By: Duanne GuessNicholas  Plundo D.O.   On: 10/03/2021 10:41    Micro Results   *** Recent Results (from the past 240 hour(s))  Surgical PCR screen     Status: Abnormal   Collection Time: 10/04/21  5:36 AM   Specimen: Nasal Mucosa; Nasal Swab  Result Value Ref Range Status   MRSA, PCR NEGATIVE NEGATIVE Final   Staphylococcus aureus POSITIVE (A) NEGATIVE Final    Comment: RESULT CALLED TO, READ BACK BY AND VERIFIED WITH:  A. RAY @ [phone] BY STEPHTR 10/04/21 (NOTE) The Xpert SA Assay (FDA approved for NASAL specimens in patients 63 years of age and older), is one component of a comprehensive surveillance program. It is not intended to diagnose infection nor to guide or monitor treatment. Performed at [facility], [address] [place], [place] [address]     Today   Subjective    [name] today has no ***          Patient has been seen and examined prior to discharge   Objective   Blood pressure (!) 151/93, pulse 93, temperature 98.3 F (36.8 C), temperature source Oral, resp. rate 20, height  5' 10.5" (1.791 m), weight 97.5 kg, SpO2 97 %.   Intake/Output Summary (Last 24 hours) at 10/05/2021 1130 Last data filed at 10/05/2021 0534 Gross per 24 hour  Intake 800 ml  Output 1050 ml  Net -250 ml    Exam Gen:- Awake Alert, no acute distress *** HEENT:- [place].AT, No sclera icterus Neck-Supple Neck,No JVD,.  Lungs-  CTAB , good air movement bilaterally CV- S1, S2 normal, regular Abd-  +ve B.Sounds, Abd Soft, No tenderness,    Extremity/Skin:- No  edema,   good pulses Psych-affect is appropriate, oriented x3 Neuro-no new focal deficits, no tremors ***   Data  Review   CBC w Diff:  Lab Results  Component Value Date   WBC 4.8 10/05/2021   HGB 11.1 (L) 10/05/2021   HCT 33.0 (L) 10/05/2021   PLT 279 10/05/2021   LYMPHOPCT 21 10/02/2021   MONOPCT 7 10/02/2021   EOSPCT 1 10/02/2021   BASOPCT 0 10/02/2021    CMP:  Lab Results  Component Value Date   NA 133 (L) 10/05/2021   K 3.3 (L) 10/05/2021   CL 100 10/05/2021   CO2 27 10/05/2021   BUN 12 10/05/2021   CREATININE 0.87 10/05/2021   PROT 5.9 (L) 10/05/2021   ALBUMIN 2.8 (L) 10/05/2021   BILITOT 0.4 10/05/2021   ALKPHOS 83 10/05/2021   AST 29 10/05/2021   ALT 37 10/05/2021  .  Total Discharge time is about 33 minutes  Shon Hale M.D on 10/05/2021 at 11:30 AM  Go to [email or web address] -  for contact info  [facility] - Office  [phone]

## 2021-10-05 NOTE — Progress Notes (Signed)
[facility]  Called [facility] Transfer Line. No patient's being accepted by Hospitalist Service at this time and no wait list. General Surgery does have a wait list, but this patient given his medical issues would not be a candidate for their service I do not think. General Surgery has not beds at this time even if the patient was accepted on the wait list.   Algis Greenhouse, MD [facility] [address] [place], [place] [address] [phone] (office)

## 2021-10-05 NOTE — Progress Notes (Signed)
Patient has left AMA, patient encouraged to follow up with PCP,verbalized understanding. Educated on new medications and foley catheter care,verbalized understanding.[name]

## 2021-10-05 NOTE — Progress Notes (Signed)
Subjective: [name] is a 61 y.o. male with a past medical history of CAD, HLD, essential hypertension, inguinal hernia, cocaine use, and alcohol use disorder presenting to the ED with one day of right leg swelling with incidental finding of large left inguinal hernia on examination, admitted for DVT workup and surgical repair of hernia. However, the surgical hernia repair has now been postponed pending transfer to a tertiary care center due to the patient's high cardiac risk for surgery following cardiology evaluation on 9/11.  Today is hospital day 3 for the patient and he reports he is feeling "miserable" due to the fact that his surgery has been cancelled.  He is considering having his Foley catheter removed and leaving the hospital AMA, but he was also open to discussion and reasoning. He reports that he has not been experiencing much back pain and his bladder pressure sensation feels much better. He has not been very hungry recently. He denies SOB, N/V/D, chest pain, or abdominal pain. He was able to ambulate to the bathroom in the AM and had a soft bowel movement without any blood in the stool.   Objective: Vital signs in last 24 hours: Temp:  [98.3 F (36.8 C)-98.5 F (36.9 C)] 98.3 F (36.8 C) (09/12 0459) Pulse Rate:  [93-109] 93 (09/12 0459) Resp:  [18-20] 20 (09/12 0459) BP: (115-156)/(65-93) 151/93 (09/12 0459) SpO2:  [96 %-98 %] 97 % (09/12 0459) Last BM Date : 10/04/21  Intake/Output from previous day: 09/11 0701 - 09/12 0700 In: 800 [P.O.:600; IV Piggyback:200] Out: 1050 [Urine:1050] Intake/Output this shift: No intake/output data recorded.  Physical Exam Constitutional:      Appearance: He is not ill-appearing.  HENT:     Head: Normocephalic and atraumatic.     Mouth/Throat:     Mouth: Mucous membranes are moist.  Cardiovascular:     Rate and Rhythm: Regular rhythm. Tachycardia present.     Pulses: Normal pulses.     Heart sounds: Normal heart sounds.   Pulmonary:     Effort: Pulmonary effort is normal.     Breath sounds: Normal breath sounds.  Abdominal:     General: Abdomen is flat. There is no distension.     Tenderness: There is no abdominal tenderness. There is no guarding or rebound.     Hernia: A hernia is present.     Comments: Large left incarcerated inguinal hernia with left scrotal involvement Small umbilical hernia  Musculoskeletal:        General: Swelling present.     Cervical back: Normal range of motion and neck supple.     Right lower leg: Edema present.     Left lower leg: Edema present.     Comments: 2+ pitting LLE edema 1+ pitting RLE edema  Neurological:     General: No focal deficit present.     Mental Status: He is alert.  Psychiatric:     Comments: Anxious about not getting surgery and visibly irritated     Lab Results:  Recent Labs    10/03/21 0538 10/05/21 0352  WBC 4.4 4.8  HGB 12.2* 11.1*  HCT 36.2* 33.0*  PLT 253 279   BMET Recent Labs    10/03/21 0538 10/05/21 0352  NA 131* 133*  K 4.1 3.3*  CL 97* 100  CO2 27 27  GLUCOSE 109* 162*  BUN 7* 12  CREATININE 0.90 0.87  CALCIUM 8.3* 8.1*   PT/INR Recent Labs    10/05/21 0352  LABPROT 13.0  INR  1.0    Studies/Results: ECHOCARDIOGRAM COMPLETE  Result Date: 10/04/2021    ECHOCARDIOGRAM REPORT   Patient Name:   SHYHEIM Kneisley Date of Exam: 10/04/2021 Medical Rec #:  [id number]        Height:       70.5 in Accession #:    [id number]       Weight:       215.0 lb Date of Birth:  1960/02/01        BSA:          2.164 m Patient Age:    61 years         BP:           144/97 mmHg Patient Gender: M                HR:           95 bpm. [facility] Procedure: 2D Echo, Cardiac Doppler and Color Doppler Indications:    Dyspnea R06.00  History:        Patient has no prior history of Echocardiogram examinations.                 CAD; Risk Factors:Hypertension, Diabetes and Dyslipidemia.                 Alcohol abuse, SUBSTANCE ABUSE.   Sonographer:    Celesta Gentile RCS Referring Phys: [phone] COURAGE EMOKPAE IMPRESSIONS  1. Left ventricular ejection fraction, by estimation, is 60 to 65%. The left ventricle has normal function. The left ventricle has no regional wall motion abnormalities. There is mild concentric left ventricular hypertrophy. Left ventricular diastolic parameters are consistent with Grade I diastolic dysfunction (impaired relaxation).  2. Right ventricular systolic function is normal. The right ventricular size is normal. Tricuspid regurgitation signal is inadequate for assessing PA pressure.  3. Left atrial size was moderately dilated.  4. The mitral valve is grossly normal. Mild mitral valve regurgitation.  5. The aortic valve was not well visualized. Aortic valve regurgitation is mild.  6. The inferior vena cava is normal in size with greater than 50% respiratory variability, suggesting right atrial pressure of 3 mmHg.  7. Prominent epicardial fat pad and trivial anterior pericardial effusion. Comparison(s): No prior Echocardiogram. FINDINGS  Left Ventricle: Left ventricular ejection fraction, by estimation, is 60 to 65%. The left ventricle has normal function. The left ventricle has no regional wall motion abnormalities. The left ventricular internal cavity size was normal in size. There is  mild concentric left ventricular hypertrophy. Left ventricular diastolic parameters are consistent with Grade I diastolic dysfunction (impaired relaxation). Right Ventricle: The right ventricular size is normal. No increase in right ventricular wall thickness. Right ventricular systolic function is normal. Tricuspid regurgitation signal is inadequate for assessing PA pressure. Left Atrium: Left atrial size was moderately dilated. Right Atrium: Right atrial size was normal in size. Pericardium: Trivial pericardial effusion is present. The pericardial effusion is anterior to the right ventricle. Presence of epicardial fat layer. Mitral Valve:  The mitral valve is grossly normal. Mild mitral valve regurgitation. Tricuspid Valve: The tricuspid valve is grossly normal. Tricuspid valve regurgitation is trivial. Aortic Valve: The aortic valve was not well visualized. There is mild aortic valve annular calcification. Aortic valve regurgitation is mild. Pulmonic Valve: The pulmonic valve was grossly normal. Pulmonic valve regurgitation is trivial. Aorta: The aortic root is normal in size and structure. Venous: The inferior vena cava is normal in size with greater than 50% respiratory variability,  suggesting right atrial pressure of 3 mmHg. IAS/Shunts: The interatrial septum appears to be lipomatous. No atrial level shunt detected by color flow Doppler.  LEFT VENTRICLE PLAX 2D LVIDd:         4.50 cm LVIDs:         3.10 cm LV PW:         1.20 cm LV IVS:        1.30 cm LVOT diam:     2.10 cm LV SV:         84 LV SV Index:   39 LVOT Area:     3.46 cm  RIGHT VENTRICLE RV S prime:     14.10 cm/s TAPSE (M-mode): 2.1 cm LEFT ATRIUM              Index        RIGHT ATRIUM           Index LA diam:        4.20 cm  1.94 cm/m   RA Area:     15.50 cm LA Vol (A2C):   89.8 ml  41.50 ml/m  RA Volume:   38.00 ml  17.56 ml/m LA Vol (A4C):   111.0 ml 51.30 ml/m LA Biplane Vol: 102.0 ml 47.14 ml/m  AORTIC VALVE LVOT Vmax:   119.00 cm/s LVOT Vmean:  82.600 cm/s LVOT VTI:    0.243 m  AORTA Ao Root diam: 3.40 cm MITRAL VALVE MV Area (PHT): 2.81 cm     SHUNTS MV Decel Time: 270 msec     Systemic VTI:  0.24 m MV E velocity: 73.00 cm/s   Systemic Diam: 2.10 cm MV A velocity: 110.50 cm/s MV E/A ratio:  0.66 Nona Dell MD Electronically signed by Nona Dell MD Signature Date/Time: 10/04/2021/4:21:42 PM    Final    US Venous Img Lower Unilateral Right (DVT)  Result Date: 10/03/2021 CLINICAL DATA:  Chronic RIGHT lower extremity edema and redness. History of cellulitis. Rule out DVT. EXAM: RIGHT LOWER EXTREMITY VENOUS DOPPLER ULTRASOUND TECHNIQUE: Gray-scale sonography with  compression, as well as color and duplex ultrasound, were performed to evaluate the deep venous system(s) from the level of the common femoral vein through the popliteal and proximal calf veins. COMPARISON:  None Available. FINDINGS: VENOUS Normal compressibility of the common femoral, superficial femoral, and popliteal veins, as well as the visualized calf veins. Visualized portions of profunda femoral vein and great saphenous vein unremarkable. No filling defects to suggest DVT on grayscale or color Doppler imaging. Doppler waveforms show normal direction of venous flow, normal respiratory plasticity and response to augmentation. Limited views of the contralateral common femoral vein are unremarkable. OTHER Ill-defined fluid/edema within the subcutaneous soft tissues of the RIGHT calf. Limitations: none IMPRESSION: Negative.  No DVT is identified within the RIGHT lower extremity. Electronically Signed   By: Bary Richard M.D.   On: 10/03/2021 16:41   CT PELVIS W CONTRAST  Result Date: 10/03/2021 CLINICAL DATA:  Hernia, complicated, assess contents of hernia for operative planning. EXAM: CT PELVIS WITH CONTRAST TECHNIQUE: Multidetector CT imaging of the pelvis was performed using the standard protocol following the bolus administration of intravenous contrast. RADIATION DOSE REDUCTION: This exam was performed according to the departmental dose-optimization program which includes automated exposure control, adjustment of the mA and/or kV according to patient size and/or use of iterative reconstruction technique. CONTRAST:  OMNIPAQUE IOHEXOL 300 MG/ML  SOLN COMPARISON:  Report of CT abdomen and pelvis 10/21/2018 at [facility] [place] FINDINGS:  Urinary Tract: The ureters are dilated bilaterally extending into a left inguinal hernia. Majority of the urinary bladder extends into the hernia. Ureterovesical junction is at the upper extent of the hernia. Stranding is present about both kidneys which are partially  imaged. Bowel: Most of the sigmoid colon extends into the left inguinal hernia without obstruction. Diverticular changes are present in the colon without focal inflammation. Small bowel is unremarkable. No small bowel loops enter the hernia. Vascular/Lymphatic: Atherosclerotic calcifications present the aorta branch vessels. No aneurysm present. Reproductive: Testicles are within normal limits by CT criteria. There is some thickening about the left hemiscrotum associated with the hernia. Other: Paraumbilical hernia contains fat without bowel. The opening is 14 mm. No significant free fluid or free air is present. Musculoskeletal: Bony pelvis is within normal limits. 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 Left hip is within normal limits. IMPRESSION: 1. Large left inguinal hernia contains majority of the sigmoid colon without obstruction. 2. The urinary bladder extends into the left inguinal hernia with partial obstruction of the ureters which are dilated to the level of the kidneys with moderate hydronephrosis and stranding about both kidneys. 3. Colonic diverticulosis without diverticulitis. 4. 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 5. Aortic Atherosclerosis (ICD10-I70.0). Electronically Signed   By: Marin Roberts M.D.   On: 10/03/2021 13:12   US SCROTUM  Result Date: 10/03/2021 CLINICAL DATA:  Scrotal edema for 5 years.  Hernia EXAM: ULTRASOUND OF SCROTUM TECHNIQUE: Complete ultrasound examination of the testicles, epididymis, and other scrotal structures was performed. COMPARISON:  None Available. FINDINGS: The bilateral testicles were unable to be visualized secondary to a very large left-sided bowel containing hernia extending into the scrotum. Diffuse scrotal wall edema. IMPRESSION: Nondiagnostic scrotal ultrasound as testicles were  unable to be visualized secondary to patient's large inguinal hernia. Electronically Signed   By: Duanne Guess D.O.   On: 10/03/2021 10:41    Anti-infectives: Anti-infectives (From admission, onward)    Start     Dose/Rate Route Frequency Ordered Stop   10/03/21 0230  cefTRIAXone (ROCEPHIN) 1 g in sodium chloride 0.9 % 100 mL IVPB        1 g 200 mL/hr over 30 Minutes Intravenous Every 24 hours 10/03/21 0131 10/10/21 0229       Assessment/Plan: Alby Schwabe is a 61 y.o. male with a past medical history of CAD, HLD, essential hypertension, inguinal hernia, recent cocaine use, and alcohol use disorder with a significant left incarcerated inguinal hernia requiring repair at a tertiary care center due to the patient's high perioperative cardiac risk index with 11% chance of major adverse cardiac event risk per cardiology.  He is thus not currently a surgical candidate at [facility], but he still requires urgent (not emergent) hernia repair and efforts to transfer him to a tertiary care center [facility], [facility], etc) are underway. Should the patient leave AMA, he has been advised to seek care for his inguinal hernia soon due to its obstruction of his bladder with associated hydronephrosis and persistent Foley catheter requirement.   LOS: 3 days    Jacky Dross [facility] 10/05/2021  8:42 AM

## 2023-07-25 DEATH — deceased
